# Patient Record
Sex: Female | Born: 1975 | Race: Black or African American | Hispanic: No | Marital: Single | Smoking: Current every day smoker
Health system: Southern US, Community
[De-identification: ages and names within clinical notes are randomized; demographics above are authoritative.]

## PROBLEM LIST (undated history)

## (undated) DIAGNOSIS — D509 Iron deficiency anemia, unspecified: Secondary | ICD-10-CM

## (undated) DIAGNOSIS — I509 Heart failure, unspecified: Secondary | ICD-10-CM

## (undated) HISTORY — DX: Heart failure, unspecified: I50.9

---

## 1998-01-30 ENCOUNTER — Inpatient Hospital Stay (HOSPITAL_COMMUNITY): Admission: AD | Admit: 1998-01-30 | Discharge: 1998-01-30 | Payer: Self-pay | Admitting: Obstetrics & Gynecology

## 1998-02-27 ENCOUNTER — Inpatient Hospital Stay (HOSPITAL_COMMUNITY): Admission: AD | Admit: 1998-02-27 | Discharge: 1998-02-27 | Payer: Self-pay | Admitting: Obstetrics

## 1998-02-28 ENCOUNTER — Inpatient Hospital Stay (HOSPITAL_COMMUNITY): Admission: AD | Admit: 1998-02-28 | Discharge: 1998-03-01 | Payer: Self-pay | Admitting: *Deleted

## 1998-03-03 ENCOUNTER — Inpatient Hospital Stay (HOSPITAL_COMMUNITY): Admission: AD | Admit: 1998-03-03 | Discharge: 1998-03-03 | Payer: Self-pay | Admitting: Obstetrics & Gynecology

## 1998-10-10 ENCOUNTER — Inpatient Hospital Stay (HOSPITAL_COMMUNITY): Admission: AD | Admit: 1998-10-10 | Discharge: 1998-10-10 | Payer: Self-pay | Admitting: *Deleted

## 1998-11-05 ENCOUNTER — Inpatient Hospital Stay (HOSPITAL_COMMUNITY): Admission: AD | Admit: 1998-11-05 | Discharge: 1998-11-05 | Payer: Self-pay | Admitting: Obstetrics

## 1998-11-07 ENCOUNTER — Encounter: Payer: Self-pay | Admitting: Obstetrics

## 1998-11-07 ENCOUNTER — Ambulatory Visit (HOSPITAL_COMMUNITY): Admission: RE | Admit: 1998-11-07 | Discharge: 1998-11-07 | Payer: Self-pay | Admitting: Obstetrics

## 1998-12-03 ENCOUNTER — Emergency Department (HOSPITAL_COMMUNITY): Admission: EM | Admit: 1998-12-03 | Discharge: 1998-12-03 | Payer: Self-pay | Admitting: Emergency Medicine

## 1999-02-09 ENCOUNTER — Encounter (HOSPITAL_COMMUNITY): Admission: RE | Admit: 1999-02-09 | Discharge: 1999-03-14 | Payer: Self-pay | Admitting: Obstetrics

## 1999-02-28 ENCOUNTER — Inpatient Hospital Stay (HOSPITAL_COMMUNITY): Admission: AD | Admit: 1999-02-28 | Discharge: 1999-02-28 | Payer: Self-pay | Admitting: Obstetrics

## 1999-03-10 ENCOUNTER — Observation Stay (HOSPITAL_COMMUNITY): Admission: AD | Admit: 1999-03-10 | Discharge: 1999-03-11 | Payer: Self-pay | Admitting: Obstetrics

## 1999-03-13 ENCOUNTER — Inpatient Hospital Stay (HOSPITAL_COMMUNITY): Admission: AD | Admit: 1999-03-13 | Discharge: 1999-03-15 | Payer: Self-pay | Admitting: *Deleted

## 1999-03-13 ENCOUNTER — Inpatient Hospital Stay (HOSPITAL_COMMUNITY): Admission: AD | Admit: 1999-03-13 | Discharge: 1999-03-13 | Payer: Self-pay | Admitting: *Deleted

## 2000-10-02 ENCOUNTER — Other Ambulatory Visit: Admission: RE | Admit: 2000-10-02 | Discharge: 2000-10-02 | Payer: Self-pay | Admitting: Obstetrics and Gynecology

## 2000-11-26 ENCOUNTER — Observation Stay (HOSPITAL_COMMUNITY): Admission: AD | Admit: 2000-11-26 | Discharge: 2000-11-27 | Payer: Self-pay | Admitting: Obstetrics and Gynecology

## 2001-04-21 ENCOUNTER — Inpatient Hospital Stay (HOSPITAL_COMMUNITY): Admission: AD | Admit: 2001-04-21 | Discharge: 2001-04-22 | Payer: Self-pay | Admitting: *Deleted

## 2003-05-15 ENCOUNTER — Inpatient Hospital Stay (HOSPITAL_COMMUNITY): Admission: AD | Admit: 2003-05-15 | Discharge: 2003-05-17 | Payer: Self-pay | Admitting: Obstetrics & Gynecology

## 2003-05-18 ENCOUNTER — Encounter: Admission: RE | Admit: 2003-05-18 | Discharge: 2003-05-18 | Payer: Self-pay | Admitting: *Deleted

## 2003-06-22 ENCOUNTER — Inpatient Hospital Stay (HOSPITAL_COMMUNITY): Admission: AD | Admit: 2003-06-22 | Discharge: 2003-06-25 | Payer: Self-pay | Admitting: Obstetrics and Gynecology

## 2003-06-23 ENCOUNTER — Encounter (INDEPENDENT_AMBULATORY_CARE_PROVIDER_SITE_OTHER): Payer: Self-pay | Admitting: Specialist

## 2004-12-09 ENCOUNTER — Ambulatory Visit: Payer: Self-pay | Admitting: Obstetrics and Gynecology

## 2004-12-09 ENCOUNTER — Inpatient Hospital Stay (HOSPITAL_COMMUNITY): Admission: AD | Admit: 2004-12-09 | Discharge: 2004-12-11 | Payer: Self-pay | Admitting: *Deleted

## 2010-01-02 ENCOUNTER — Emergency Department (HOSPITAL_COMMUNITY): Admission: EM | Admit: 2010-01-02 | Discharge: 2010-01-02 | Payer: Self-pay | Admitting: Family Medicine

## 2010-01-02 ENCOUNTER — Emergency Department (HOSPITAL_COMMUNITY): Admission: EM | Admit: 2010-01-02 | Discharge: 2010-01-02 | Payer: Self-pay | Admitting: Emergency Medicine

## 2010-01-04 ENCOUNTER — Emergency Department (HOSPITAL_COMMUNITY): Admission: EM | Admit: 2010-01-04 | Discharge: 2010-01-04 | Payer: Self-pay | Admitting: Emergency Medicine

## 2010-08-03 LAB — BASIC METABOLIC PANEL
CO2: 20 mEq/L (ref 19–32)
Calcium: 9.1 mg/dL (ref 8.4–10.5)
Chloride: 104 mEq/L (ref 96–112)
Potassium: 3.1 mEq/L — ABNORMAL LOW (ref 3.5–5.1)

## 2010-08-03 LAB — CBC
Hemoglobin: 8.5 g/dL — ABNORMAL LOW (ref 12.0–15.0)
MCH: 18.9 pg — ABNORMAL LOW (ref 26.0–34.0)
MCHC: 28.5 g/dL — ABNORMAL LOW (ref 30.0–36.0)
RBC: 4.49 MIL/uL (ref 3.87–5.11)
RDW: 19.4 % — ABNORMAL HIGH (ref 11.5–15.5)

## 2010-08-03 LAB — DIFFERENTIAL
Basophils Absolute: 0.1 10*3/uL (ref 0.0–0.1)
Basophils Relative: 1 % (ref 0–1)
Eosinophils Absolute: 0.1 10*3/uL (ref 0.0–0.7)
Eosinophils Relative: 1 % (ref 0–5)
Lymphocytes Relative: 10 % — ABNORMAL LOW (ref 12–46)
Monocytes Relative: 9 % (ref 3–12)

## 2010-10-05 NOTE — Discharge Summary (Signed)
NAME:  Shelby Ford, Shelby Ford                     ACCOUNT NO.:  [id number]   MEDICAL RECORD NO.:  [id number]                   PATIENT TYPE:  INP   LOCATION:  9172                                 FACILITY:  [facility]   PHYSICIAN:  Ursula Beath, MD               DATE OF BIRTH:  [birth year]   DATE OF ADMISSION:  05/15/2003  DATE OF DISCHARGE:  05/17/2003                                 DISCHARGE SUMMARY   DISCHARGE DIAGNOSES:  1. Placental abruption, vaginal bleeding.  2. Urinary tract infection.  3. Anemia.   Please note that this patient has left the hospital against medical advice.   DISCHARGE MEDICATIONS:  Macrobid one tablet b.i.d. x 10 days.   FOLLOWUP:  [facility] on 05/18/2003 between 8:30-9:00.   The patient was admitted, on 05/15/2003, with vaginal bleeding.  She was  laying in bed, the day of admission, when her vaginal bleeding started, and  she passed a large clot.  She denied a history of previous trauma.  Her  source of care was in [place], but we have no records from there.  She has  not had prenatal care for three months.  She is a smoker.  The baby is up  for adoption.   ADMISSION LABS:  Her CBC:  White count 15.9, H&H 7.2 and 21.4, platelets of  184.  PT 13.9, INR 1.1, PTT 23.  A D-dimmer is 0.33.  Chemistry:  Sodium  135, potassium 3.8, chloride 109, bicarb 20, BUN 7, creatinine 0.7, glucose  105.  Total bili 0.5, alk phos 55, SGOT 40, SGPT less than 19, total protein  7.1, albumin 3.2, calcium 8.5, uric acid 4.6.  LDH 207.  Hepatitis B surface  antigen is negative.  Urine drug screen is negative.  Urinalysis is positive  for nitrites and large leukocyte esterase.  She is B positive blood,  negative antibody screen, negative DNA antibody, negative RPR and is rubella  immune.  A urine culture shows greater than 100,000 colonies of E. coli.  Sensitivities were pending at the time of discharge.   MEDICAL HISTORY:  She has:  1. Sickle cell trait.  2. Multiple  fractures.   OBSTETRICAL HISTORY:  She has had four normal spontaneous vaginal deliveries  at term with a history of rapid labors.  She is G5, P4-0-0-4 with one child  that had been adopted.   IMPRESSION:  1. The patient was admitted for her severe anemia and symptoms of this     anemia.  She was given a total of 5 units packed red blood cells, and     prior to discharge, her hemoglobin increased to 8.9.  It should be noted     that she had one subsequent unit of packed red blood cells after that was     done.  Her vaginal bleeding decreased to just some dark spotting.  She     denied contractions and continued to  feel the baby move during her entire     hospitalization.  She did have one notable deceleration and this was     discussed with the patient before her discharge from the hospital, but     she opted to leave the hospital against medical advice,  despite the fact     that she was informed that her bleeding and the deceleration may mean     that her fetus is in trouble, and she could compromise the health or     even life of this fetus.  2. Urinary tract infection.  As noted above in lab results.  She was given     Macrobid at discharge from the hospital.  The baby should be checked in a     high risk clinic as an outpatient.                                               Ursula Beath, MD    JT/MEDQ  D:  05/17/2003  T:  05/17/2003  Job:  [id number]

## 2010-10-05 NOTE — Discharge Summary (Signed)
[facility]  Patient:    Shelby Ford, Shelby Ford                       MRN: [id number] Adm. Date:  11/26/00 Disc. Date: 11/27/00 Attending:  Janine Limbo, M.D. Dictator:   Vance Gather Duplantis, C.N.M.                           Discharge Summary  ADMISSION DIAGNOSES:          1. Intrauterine pregnancy at [redacted] weeks                                  gestation.                               2. Pyelonephritis.  DISCHARGE DIAGNOSES:          1. Intrauterine pregnancy at [redacted] weeks                                  gestation.                               2. Pyelonephritis, resolving.                               3. Significant anemia.  PROCEDURES THIS ADMISSION:    None.  HOSPITAL COURSE:              Shelby Ford is a 35 year old, single black female, gravida 4, para 2-1-1-2 at 18-5/7 weeks who presents complaining of fever, chills, general malaise and right-sided pain for the last couple of days, increasing today, July 10. She was admitted for IV antibiotic therapy secondary to significant fever and positive urinalysis. She has responded well to the IV antibiotics and is no longer febrile and has had no fever since midnight last night. She is tolerating a regular diet. Her vital signs are stable. Her pregnancy has been complicated by history of sickle cell trait and she is anemic on this admission with a hemoglobin of 7.4. She also reports difficulty currently with an address change on her Medicaid card so that she does not have her Medicaid card and will have difficulty filling prescriptions as a result. The patient has been deemed to have received full hospital benefit and is deemed ready for discharge at this time.  DISCHARGE INSTRUCTIONS:       She is to call for reoccurrence of fever, nausea or vomiting, chills, or dysuria. She is also to call for vaginal bleeding or contractions.  DISCHARGE MEDICATIONS:        Macrobid one p.o. b.i.d. for 10 days and  is to pick up samples of the same from [facility] office. She may also take Ibuprofen or Tylenol as needed, and she is also to be on ferrous sulfate b.i.d.  DISCHARGE FOLLOWUP:           The patient is to follow up in one to two weeks at [facility].  DISCHARGE LABORATORIES:       Hemoglobin is 7.4, WBC count is 12.1, and platelets are 223,000.DD:  11/27/00 TD:  11/27/00 Job: [id number]

## 2010-10-05 NOTE — H&P (Signed)
[facility]  Patient:    Shelby Ford, Shelby Ford                     MRN: [id number] Adm. Date:  [id number] Attending:  Leonard Schwartz Dictator:   Mack Guise, C.N.M.                         History and Physical  HISTORY:                      The patient is a 35 year old gravida 4, para 2-1-1-2 at 18-5/7 weeks who presents by EMS for fever, chills and general malaise.  She complains of right-sided pain for the past two days, becoming worse today.  She does not complain of any cold symptoms, no nausea or vomiting.  She does experience some burning with urination.  She reports positive fetal movement.  PAST OBSTETRIC HISTORY:       In 1996, normal spontaneous vaginal delivery with the birth of a 6 lb 2 oz female infant at term with no complications.  iN 1998, SAB.  In 1990, normal spontaneous vaginal delivery with the birth of a 7 lb 2 oz female infant at term with no complications.  PAST MEDICAL HISTORY:         Remarkable for sickle cell trait.  The patient had multiple fractures when she was much younger.  FAMILY HISTORY:               Maternal grandmother with a history of chronic hypertension.  Paternal grandmother with hypertension.  Paternal grandmother with diabetes.  Maternal grandmother rheumatoid arthritis.  Maternal aunt leukemia.  PAST SURGICAL HISTORY:        None.  GENETIC HISTORY:              Patient with a history of sickle cell trait. Her brother and mother also have sickle cell trait.  The father of the baby has a fraternal twin brother.  A first cousin is autistic.  SOCIAL HISTORY:               The patient is a 35 year old single African-American female.  The father of the baby, Adria Devon, is involved and supportive.  They do not subscribe to a religious faith.  The patient is a smoker, but denies the use of alcohol or illicit drugs.  ALLERGIES:                    No known drug allergies.  HISTORY OF THE PRESENT  PREGNANCY:            Her pregnancy has been followed by the [facility] at [facility] and is remarkable for 1) History of rapid labors; 2) Sickle cell trait; 3) Smoker.  REVIEW OF SYSTEMS:            The patient is a 35 year old African-American female at [redacted] weeks gestation who presents via EMS with fever and chills as described above.  PHYSICAL EXAMINATION:  VITAL SIGNS:                  Temperature 103.1, repeated 102.7.  Pulse 64, respirations 22, blood pressure 98/50.  HEENT:                        Unremarkable.  HEART:  Regular rate and rhythm.  LUNGS:                        Clear.  ABDOMEN:                      Gravid in contour.  The uterine fundus is noted to extend 18 cm above the level of the pubic symphysis.  The abdomen is soft and nontender.  Positive fetal heart tones in the 190s.  She has negative CVA tenderness bilaterally.  PELVIC:                       External genitalia normal with no lesions.  The vagina with a white, frothy discharge.  The cervix is long, closed and nontender.  Adnexa show no masses and nontender.  The uterus is gravid, 18 weeks in size and nontender.  EXTREMITIES:                  No edema.  LABORATORY DATA:              Catheterization UA is positive for nitrites, small leukocytes, trace blood, wbcs 21-50 with many bacteria.  CBC shows white blood cell count 12.1, hemoglobin and hematocrit 7.4 and 22.9, platelets 223,000, differential shows neutrophils 76 and bands 17.  Wet prep was positive for Trichomonas, clue cells, many wbcs and bacteria.  GC and Chlamydia cultures were obtained.  ASSESSMENT:                   Intrauterine pregnancy at 18-5/7 weeks with pyelonephritis and Trichomonas.  Consult Dr. Stefano Gaul.  PLAN:                         Admit for 23-hour observation.  IV Ancef q.6h. IV hydration with D5LR at 150 cc/hr.  Tylenol 650 mg p.o. q.4h. for temperature greater than 100.  Phenergan 25 mg p.o. q.6h.  p.r.n. nausea and vomiting.  Metronidazole 2 g p.o. DD:  11/26/00 TD:  11/27/00 Job: [id number]

## 2012-03-16 ENCOUNTER — Emergency Department (HOSPITAL_COMMUNITY)
Admission: EM | Admit: 2012-03-16 | Discharge: 2012-03-16 | Disposition: A | Discharge status: Home / Self Care | Payer: Self-pay | Attending: Emergency Medicine | Admitting: Emergency Medicine

## 2012-03-16 ENCOUNTER — Encounter (HOSPITAL_COMMUNITY): Payer: Self-pay | Admitting: *Deleted

## 2012-03-16 ENCOUNTER — Emergency Department (HOSPITAL_COMMUNITY): Payer: Self-pay

## 2012-03-16 DIAGNOSIS — K219 Gastro-esophageal reflux disease without esophagitis: Secondary | ICD-10-CM

## 2012-03-16 DIAGNOSIS — D649 Anemia, unspecified: Secondary | ICD-10-CM

## 2012-03-16 LAB — CBC WITH DIFFERENTIAL/PLATELET
Basophils Relative: 2 % — ABNORMAL HIGH (ref 0–1)
Eosinophils Relative: 2 % (ref 0–5)
HCT: 24.9 % — ABNORMAL LOW (ref 36.0–46.0)
Hemoglobin: 7.6 g/dL — ABNORMAL LOW (ref 12.0–15.0)
Lymphs Abs: 1.7 10*3/uL (ref 0.7–4.0)
Monocytes Absolute: 0.6 10*3/uL (ref 0.1–1.0)
Monocytes Relative: 13 % — ABNORMAL HIGH (ref 3–12)
Neutrophils Relative %: 44 % (ref 43–77)
RBC: 3.55 MIL/uL — ABNORMAL LOW (ref 3.87–5.11)

## 2012-03-16 LAB — POCT I-STAT, CHEM 8
BUN: 3 mg/dL — ABNORMAL LOW (ref 6–23)
Creatinine, Ser: 0.7 mg/dL (ref 0.50–1.10)
Glucose, Bld: 87 mg/dL (ref 70–99)
Sodium: 141 mEq/L (ref 135–145)
TCO2: 18 mmol/L (ref 0–100)

## 2012-03-16 LAB — D-DIMER, QUANTITATIVE: D-Dimer, Quant: 0.3 ug/mL-FEU (ref 0.00–0.48)

## 2012-03-16 MED ORDER — KETOROLAC TROMETHAMINE 30 MG/ML IJ SOLN
30.0000 mg | Freq: Once | INTRAMUSCULAR | Status: AC
  Administered 2012-03-16: 30 mg via INTRAVENOUS
  Filled 2012-03-16: qty 1

## 2012-03-16 MED ORDER — OMEPRAZOLE 20 MG PO CPDR: 20.0000 mg | DELAYED_RELEASE_CAPSULE | Freq: Every day | ORAL | Status: AC

## 2012-03-16 MED ORDER — FERROUS SULFATE 325 (65 FE) MG PO TABS: 325.0000 mg | ORAL_TABLET | Freq: Three times a day (TID) | ORAL | Status: DC

## 2012-03-16 MED ORDER — GI COCKTAIL ~~LOC~~
30.0000 mL | Freq: Once | ORAL | Status: AC
  Administered 2012-03-16: 30 mL via ORAL
  Filled 2012-03-16: qty 30

## 2012-03-16 NOTE — ED Notes (Signed)
Notified pt that we need a urine sample.  Pt st's she doesn't have to go, will check back shortly.

## 2012-03-16 NOTE — ED Notes (Signed)
Patient back from  X-ray 

## 2012-03-16 NOTE — ED Notes (Signed)
Patient transported to X-ray 

## 2012-03-16 NOTE — ED Notes (Signed)
Per EMS:  Pt had sudden onset of SSCP, non radiating, woke her up from sleep, pain is stabbing, hurts on inspiration and palpation.  Some nausea, pt has non-productive cough, no diaphoresis.  Pt has no medical history, no allergies, no meds.  Pt had 324 ASA in route.

## 2012-03-16 NOTE — ED Provider Notes (Signed)
History     CSN: [id number]  Arrival date & time 03/16/12  0459   First MD Initiated Contact with Patient 03/16/12 [phone]      No chief complaint on file.   (Consider location/radiation/quality/duration/timing/severity/associated sxs/prior treatment) Patient is a 36 y.o. female presenting with chest pain. The history is provided by the patient.  Chest Pain The chest pain began 1 - 2 hours ago. Chest pain occurs frequently. The chest pain is unchanged. Associated with: none. At its most intense, the pain is at 10/10. The pain is currently at 10/10. The severity of the pain is severe. The quality of the pain is described as sharp. The pain does not radiate. Primary symptoms include cough. Pertinent negatives for primary symptoms include no fever, no syncope, no shortness of breath, no wheezing, no palpitations, no abdominal pain, no nausea, no vomiting, no dizziness and no altered mental status.  Pertinent negatives for associated symptoms include no claudication. She tried nothing for the symptoms. Risk factors include smoking/tobacco exposure.  Pertinent negatives for past medical history include no CAD and no MI.  Pertinent negatives for family medical history include: no early MI in family.  Procedure history is negative for cardiac catheterization.     History reviewed. No pertinent past medical history.  History reviewed. No pertinent past surgical history.  No family history on file.  History  Substance Use Topics  . Smoking status: Not on file  . Smokeless tobacco: Not on file  . Alcohol Use: Not on file    OB History    Grav Para Term Preterm Abortions TAB SAB Ect Mult Living                  Review of Systems  Constitutional: Negative for fever.  Respiratory: Positive for cough. Negative for shortness of breath and wheezing.   Cardiovascular: Positive for chest pain. Negative for palpitations, claudication and syncope.  Gastrointestinal: Negative for nausea,  vomiting and abdominal pain.  Neurological: Negative for dizziness.  Psychiatric/Behavioral: Negative for altered mental status.  All other systems reviewed and are negative.    Allergies  Review of patient's allergies indicates no known allergies.  Home Medications  No current outpatient prescriptions on file.  BP 112/67  Pulse 104  Temp 98.5 F (36.9 C) (Oral)  Resp 22  SpO2 100%  Physical Exam  Constitutional: She is oriented to person, place, and time. She appears well-developed and well-nourished. No distress.  HENT:  Head: Normocephalic and atraumatic.  Mouth/Throat: Oropharynx is clear and moist.  Eyes: Conjunctivae normal are normal. Pupils are equal, round, and reactive to light.  Neck: Normal range of motion. Neck supple.  Cardiovascular: Normal rate and regular rhythm.   Pulmonary/Chest: Effort normal and breath sounds normal. No respiratory distress. She has no wheezes. She has no rales.  Abdominal: Soft. Bowel sounds are normal. There is no tenderness. There is no rebound and no guarding.  Musculoskeletal: Normal range of motion.  Neurological: She is alert and oriented to person, place, and time.  Skin: Skin is warm and dry.  Psychiatric: She has a normal mood and affect.    ED Course  Procedures (including critical care time)   Labs Reviewed  CBC WITH DIFFERENTIAL   No results found.   No diagnosis found.    MDM   Date: 03/16/2012  Rate: 89  Rhythm: normal sinus rhythm  QRS Axis: normal  Intervals: normal  ST/T Wave abnormalities: normal  Conduction Disutrbances: none  Narrative Interpretation: unremarkable  Discussed anemia with the patient.  Patient has heavy periods.  Will place on iron and have her follow up with her GYN.  Negative EKG and 2 negative troponins is sufficient to exclude ACS.  Very low suspicion for same pain is very atypical.  Symptoms consistent with GERD given she ate taco salad prior to onset of sx and GI cocktail  relieved the pain.  Return for worsening symptoms. Patient verbalizes understanding and agrees to follow up        Teona Vargus Smitty Cords, MD 03/16/12 [phone]

## 2021-03-22 ENCOUNTER — Emergency Department (HOSPITAL_BASED_OUTPATIENT_CLINIC_OR_DEPARTMENT_OTHER): Payer: 59

## 2021-03-22 ENCOUNTER — Encounter (HOSPITAL_BASED_OUTPATIENT_CLINIC_OR_DEPARTMENT_OTHER): Payer: Self-pay

## 2021-03-22 ENCOUNTER — Emergency Department (HOSPITAL_BASED_OUTPATIENT_CLINIC_OR_DEPARTMENT_OTHER)
Admission: EM | Admit: 2021-03-22 | Discharge: 2021-03-22 | Discharge status: Against Medical Advice | Payer: 59 | Attending: Emergency Medicine | Admitting: Emergency Medicine

## 2021-03-22 ENCOUNTER — Other Ambulatory Visit: Payer: Self-pay

## 2021-03-22 DIAGNOSIS — D649 Anemia, unspecified: Secondary | ICD-10-CM | POA: Diagnosis not present

## 2021-03-22 DIAGNOSIS — R0602 Shortness of breath: Secondary | ICD-10-CM | POA: Diagnosis present

## 2021-03-22 DIAGNOSIS — E876 Hypokalemia: Secondary | ICD-10-CM | POA: Insufficient documentation

## 2021-03-22 DIAGNOSIS — R600 Localized edema: Secondary | ICD-10-CM | POA: Diagnosis not present

## 2021-03-22 DIAGNOSIS — M7989 Other specified soft tissue disorders: Secondary | ICD-10-CM | POA: Diagnosis not present

## 2021-03-22 LAB — CBC WITH DIFFERENTIAL/PLATELET
Abs Immature Granulocytes: 0.02 10*3/uL (ref 0.00–0.07)
Basophils Absolute: 0 10*3/uL (ref 0.0–0.1)
Basophils Relative: 1 %
Eosinophils Absolute: 0 10*3/uL (ref 0.0–0.5)
Eosinophils Relative: 1 %
HCT: 28.7 % — ABNORMAL LOW (ref 36.0–46.0)
Hemoglobin: 7.2 g/dL — ABNORMAL LOW (ref 12.0–15.0)
Immature Granulocytes: 1 %
Lymphocytes Relative: 18 %
Lymphs Abs: 0.6 10*3/uL — ABNORMAL LOW (ref 0.7–4.0)
MCH: 16.2 pg — ABNORMAL LOW (ref 26.0–34.0)
MCHC: 25.1 g/dL — ABNORMAL LOW (ref 30.0–36.0)
MCV: 64.5 fL — ABNORMAL LOW (ref 80.0–100.0)
Monocytes Absolute: 0.2 10*3/uL (ref 0.1–1.0)
Monocytes Relative: 6 %
Neutro Abs: 2.6 10*3/uL (ref 1.7–7.7)
Neutrophils Relative %: 73 %
Platelets: 192 10*3/uL (ref 150–400)
RBC: 4.45 MIL/uL (ref 3.87–5.11)
RDW: 24 % — ABNORMAL HIGH (ref 11.5–15.5)
WBC: 3.5 10*3/uL — ABNORMAL LOW (ref 4.0–10.5)
nRBC: 2.9 % — ABNORMAL HIGH (ref 0.0–0.2)

## 2021-03-22 LAB — COMPREHENSIVE METABOLIC PANEL
ALT: 14 U/L (ref 0–44)
AST: 27 U/L (ref 15–41)
Albumin: 3 g/dL — ABNORMAL LOW (ref 3.5–5.0)
Alkaline Phosphatase: 90 U/L (ref 38–126)
Anion gap: 11 (ref 5–15)
BUN: 7 mg/dL (ref 6–20)
CO2: 19 mmol/L — ABNORMAL LOW (ref 22–32)
Calcium: 8.7 mg/dL — ABNORMAL LOW (ref 8.9–10.3)
Chloride: 106 mmol/L (ref 98–111)
Creatinine, Ser: 0.57 mg/dL (ref 0.44–1.00)
GFR, Estimated: >60 mL/min (ref >60)
Glucose, Bld: 124 mg/dL — ABNORMAL HIGH (ref 70–99)
Potassium: 3.1 mmol/L — ABNORMAL LOW (ref 3.5–5.1)
Sodium: 136 mmol/L (ref 135–145)
Total Bilirubin: 0.6 mg/dL (ref 0.3–1.2)
Total Protein: 6.9 g/dL (ref 6.5–8.1)

## 2021-03-22 LAB — BRAIN NATRIURETIC PEPTIDE: B Natriuretic Peptide: 831.1 pg/mL — ABNORMAL HIGH (ref 0.0–100.0)

## 2021-03-22 LAB — TROPONIN I (HIGH SENSITIVITY): Troponin I (High Sensitivity): 13 ng/L (ref <18)

## 2021-03-22 MED ORDER — POTASSIUM CHLORIDE CRYS ER 20 MEQ PO TBCR
40.0000 meq | EXTENDED_RELEASE_TABLET | Freq: Once | ORAL | Status: AC
  Administered 2021-03-22: 40 meq via ORAL
  Filled 2021-03-22: qty 2

## 2021-03-22 MED ORDER — POTASSIUM CHLORIDE CRYS ER 20 MEQ PO TBCR: 20.0000 meq | EXTENDED_RELEASE_TABLET | Freq: Two times a day (BID) | ORAL | 0 refills | Status: DC

## 2021-03-22 MED ORDER — FUROSEMIDE 20 MG PO TABS: 40.0000 mg | ORAL_TABLET | Freq: Every day | ORAL | 0 refills | Status: DC

## 2021-03-22 NOTE — ED Notes (Signed)
Patient verbalizes understanding of discharge instructions. Opportunity for questioning and answers were provided. Armband removed by staff, pt discharged from ED. Ambulated out to lobby  

## 2021-03-22 NOTE — ED Triage Notes (Signed)
Pt presents with BLE edema with dyspnea on exertion x 1 month. Denies hx of same.

## 2021-03-22 NOTE — ED Provider Notes (Signed)
I provided a substantive portion of the care of this patient.  I personally performed the entirety of the exam and medical decision making for this encounter.  EKG Interpretation  Date/Time:  Thursday March 22 2021 11:53:00 EDT Ventricular Rate:  94 PR Interval:  124 QRS Duration: 76 QT Interval:  342 QTC Calculation: 427 R Axis:   78 Text Interpretation: Normal sinus rhythm ST & T wave abnormality, consider inferolateral ischemia Abnormal ECG When compared to prior, some t wave inversion in v4-v6. No STEMI Confirmed by Antony Blackbird [phone]) on 03/22/2021 12:01:46 PM   Patient reports onset of swelling of her legs for about a month.  She reports that this came on pretty suddenly and then stayed.  She denies any pain associated.  For triage she endorsed dyspnea on exertion but for me she denied it.  She denied chest pain.  Denied fever or cough.  Patient denies abdominal pain nausea or vomiting.  Patient reportedly drinks approximately a case of beer a week.  She denies any history of medical problems.  Denies any regular medications.  Patient is alert with clear mental status.  Nontoxic.  No respiratory distress.  Heart regular.  Borderline tachycardic.  No gross rub murmur gallop.  Patient does have JVD.  Positive hepatojugular reflux. Abdomen is soft and nontender.  Mild distention.  Pitting edema 2+ of the lower legs to the knee.  Trace pitting as high as the hip.  At this point we will proceed with diagnostic evaluation for cardiac pulmonary and hepatic etiology.  I agree with plan of management.  Patient is clinically stable.   Charlesetta Shanks, MD 03/22/21 [phone]

## 2021-03-22 NOTE — ED Provider Notes (Signed)
[facility] Provider Note   CSN: [id number] Arrival date & time: 03/22/21  1058     History Chief Complaint  Patient presents with   Leg Swelling    Shelby Ford is a 45 y.o. female.  HPI Patient presents to the emergency department due to leg swelling as well as shortness of breath which has been waxing and waning for the past month.  States that over the past week her symptoms have continued to progressively worsen.  Notes dyspnea with exertion.  Not at rest.  Denies any chest pain, URI symptoms, fevers, chills, nausea, vomiting, diarrhea, urinary complaints.  Denies any history of similar symptoms in the past.  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  She smokes about 1/2 pack/day and drinks about 24 beers per week.  Denies any recent travel, recent immobilization, hemoptysis, history of blood clots, oral estrogen use.    History reviewed. No pertinent past medical history.  There are no problems to display for this patient.   History reviewed. No pertinent surgical history.   OB History   No obstetric history on file.     History reviewed. No pertinent family history.     Home Medications Prior to Admission medications   Medication Sig Start Date End Date Taking? Authorizing Provider  furosemide (LASIX) 20 MG tablet Take 2 tablets (40 mg total) by mouth daily. 03/22/21 04/21/21 Yes Rayna Sexton, PA-C  potassium chloride SA (KLOR-CON) 20 MEQ tablet Take 1 tablet (20 mEq total) by mouth 2 (two) times daily. 03/22/21 04/21/21 Yes Rayna Sexton, PA-C  ferrous sulfate 325 (65 FE) MG tablet Take 1 tablet (325 mg total) by mouth 3 (three) times daily with meals. 03/16/12   Palumbo, April, MD  omeprazole (PRILOSEC) 20 MG capsule Take 1 capsule (20 mg total) by  mouth daily. 03/16/12   Palumbo, April, MD    Allergies    Patient has no known allergies.  Review of Systems   Review of Systems  All other systems reviewed and are negative. Ten systems reviewed and are negative for acute change, except as noted in the HPI.   Physical Exam Updated Vital Signs BP 103/62   Pulse 86   Temp 98.2 F (36.8 C) (Oral)   Resp 16   Ht 5\' 3"  (1.6 m)   Wt 59.8 kg   LMP 03/08/2021 (Approximate)   SpO2 98%   BMI 23.37 kg/m   Physical Exam Vitals and nursing note reviewed.  Constitutional:      General: She is not in acute distress.    Appearance: Normal appearance. She is not ill-appearing, toxic-appearing or diaphoretic.  HENT:     Head: Normocephalic and atraumatic.     Right Ear: External ear normal.     Left Ear: External ear normal.     Nose: Nose normal.     Mouth/Throat:     Mouth: Mucous membranes are moist.     Pharynx: Oropharynx is clear. No oropharyngeal exudate or posterior oropharyngeal erythema.  Eyes:     General: No scleral icterus.       Right eye: No discharge.        Left eye: No discharge.     Extraocular Movements: Extraocular movements intact.     Conjunctiva/sclera: Conjunctivae normal.  Neck:  Comments: Positive JVD. Cardiovascular:     Rate and Rhythm: Regular rhythm. Tachycardia present.     Pulses: Normal pulses.     Heart sounds: Normal heart sounds. No murmur heard.   No friction rub. No gallop.  Pulmonary:     Effort: Pulmonary effort is normal. No respiratory distress.     Breath sounds: Normal breath sounds. No stridor. No wheezing, rhonchi or rales.     Comments: Lungs are clear to auscultation bilaterally.  Oxygen saturations fluctuating between the mid 80s to the mid 90s on room air. Abdominal:     General: Abdomen is flat.     Palpations: Abdomen is soft.     Tenderness: There is no abdominal tenderness.  Musculoskeletal:        General: Normal range of motion.     Cervical back: Normal range of  motion and neck supple. No tenderness.     Right lower leg: Edema present.     Left lower leg: Edema present.     Comments: 2+ pitting edema noted in the bilateral lower extremities.  Skin:    General: Skin is warm and dry.  Neurological:     General: No focal deficit present.     Mental Status: She is alert and oriented to person, place, and time.  Psychiatric:        Mood and Affect: Mood normal.        Behavior: Behavior normal.   ED Results / Procedures / Treatments   Labs (all labs ordered are listed, but only abnormal results are displayed) Labs Reviewed  COMPREHENSIVE METABOLIC PANEL - Abnormal; Notable for the following components:      Result Value   Potassium 3.1 (*)    CO2 19 (*)    Glucose, Bld 124 (*)    Calcium 8.7 (*)    Albumin 3.0 (*)    All other components within normal limits  BRAIN NATRIURETIC PEPTIDE - Abnormal; Notable for the following components:   B Natriuretic Peptide 831.1 (*)    All other components within normal limits  CBC WITH DIFFERENTIAL/PLATELET - Abnormal; Notable for the following components:   WBC 3.5 (*)    Hemoglobin 7.2 (*)    HCT 28.7 (*)    MCV 64.5 (*)    MCH 16.2 (*)    MCHC 25.1 (*)    RDW 24.0 (*)    nRBC 2.9 (*)    Lymphs Abs 0.6 (*)    All other components within normal limits  CBC WITH DIFFERENTIAL/PLATELET  PREGNANCY, URINE  TROPONIN I (HIGH SENSITIVITY)  TROPONIN I (HIGH SENSITIVITY)   EKG EKG Interpretation  Date/Time:  Thursday March 22 2021 11:53:00 EDT Ventricular Rate:  94 PR Interval:  124 QRS Duration: 76 QT Interval:  342 QTC Calculation: 427 R Axis:   78 Text Interpretation: Normal sinus rhythm ST & T wave abnormality, consider inferolateral ischemia Abnormal ECG When compared to prior, some t wave inversion in v4-v6. No STEMI Confirmed by Antony Blackbird [phone]) on 03/22/2021 12:01:46 PM  Radiology DG Chest 2 View  Result Date: 03/22/2021 CLINICAL DATA:  Shortness of breath EXAM: CHEST - 2 VIEW  COMPARISON:  Chest x-ray 03/16/2012 FINDINGS: Heart is enlarged. Mediastinum appears within normal limits. Pulmonary vasculature is normal. Blunting of the right costophrenic angle with mild irregular opacities at the right lung base. No significant effusion visualized on the lateral film. No pneumothorax. IMPRESSION: 1. Cardiomegaly. 2. Likely trace right pleural effusion with associated atelectasis/infiltrate at the right  lung base. Electronically Signed   By: Ofilia Neas M.D.   On: 03/22/2021 14:51    Procedures Procedures   Medications Ordered in ED Medications  potassium chloride SA (KLOR-CON) CR tablet 40 mEq (has no administration in time range)    ED Course  I have reviewed the triage vital signs and the nursing notes.  Pertinent labs & imaging results that were available during my care of the patient were reviewed by me and considered in my medical decision making (see chart for details).  Clinical Course as of 03/22/21 1958  Thu Mar 22, 2021  1937 Patient discussed with Dr. Sallyanne Kuster.  Patient is refusing admission.  She is going to sign out AMA.  He recommends that we start patient on 40 mg of Lasix once per day and 20 mg of Klor-Con twice per day.  He is going to have his team reach out to her tomorrow to schedule an appointment for follow-up.  I will also give the patient his contact information. [LJ]    Clinical Course User Index [LJ] Rayna Sexton, PA-C   MDM Rules/Calculators/A&P                          Pt is a 45 y.o. female who presents to the emergency department with waxing and waning pedal edema for the past month with intermittent DOE.  Labs: CBC with a white count of 3.5, hemoglobin of 7.2, hematocrit of 20.7, MCV of 64.5, MCH of 16.2, MCHC of 25.1, RDW 24, RBCs of 2.9, lymphocytes of 0.6. CMP with a potassium of 3.1, CO2 of 19, glucose of 124, calcium of 8.7, albumin of 3.0. Troponin of 13. BNP elevated at 831.1.  Imaging: Chest x-ray shows  cardiomegaly.  Likely trace right pleural effusion with associated atelectasis/infiltrate at the right lung base.  I, Rayna Sexton, PA-C, personally reviewed and evaluated these images and lab results as part of my medical decision-making.  Patient with new onset pedal edema as well as intermittent DOE.  Patient smokes 1/2 pack/day and states she drinks about 1 case of beer per week.  Significantly elevated BNP at 831.1.  Troponin within normal limits at 13.  Patient with 2+ pedal edema.  Feel the patient's symptoms are likely secondary to new onset CHF.  Patient discussed with Dr. Sallyanne Kuster.  As patient is declining admission and requesting to sign out AMA, he recommends that we start her on 40 mg of Lasix once per day and 20 mg of Klor-Con twice per day.  He is going to have his staff reach out to her tomorrow to schedule an appointment for reevaluation.  Patient also given his contact information.  Patient found to be hypokalemic at 3.1.  Possibly secondary to her alcohol use.  Repleted with Klor-Con.  Will discharge on a course of Klor-Con.  Patient also found to be anemic at 7.2.  Low MCV at 64.5 with an increased RDW of 24.  Patient reports history of anemia in the past.  Per records, patient's hemoglobin slightly decreased compared to her baseline.   Recommended admission on multiple occasions but patient declined.  She states that she is too busy to be admitted at this time.  I am going to have her sign out [place].  We discussed the risks associated with leaving [facility] and patient verbalized understanding.  Urged her to return to the emergency department if she changes her mind.  She was prescribed Lasix  as well as Klor-Con.  She was given the contact information for cardiology.  Note: Portions of this report may have been transcribed using voice recognition software. Every effort was made to ensure accuracy; however, inadvertent computerized transcription errors  may be present.   Final Clinical Impression(s) / ED Diagnoses Final diagnoses:  Leg swelling  Anemia, unspecified type  Hypokalemia   Rx / DC Orders ED Discharge Orders          Ordered    furosemide (LASIX) 20 MG tablet  Daily        03/22/21 1956    potassium chloride SA (KLOR-CON) 20 MEQ tablet  2 times daily        03/22/21 1956             Rayna Sexton, PA-C 03/22/21 2004    Tegeler, Gwenyth Allegra, MD 03/23/21 [phone]

## 2021-03-22 NOTE — ED Notes (Signed)
Attempted IV, vein blew.  Pt states she is a hard stick.  Notified EDP.   Pt states that she would rather just have labs drawn instead of IV

## 2021-03-22 NOTE — Discharge Instructions (Addendum)
You are leaving [place].  Please understand that there is a risk of sudden death with your symptoms today.  If you change your mind please come back to the emergency department for admission.  I prescribed you 2 medications.  First medication is called Lasix.  I want you to take 40 mg of Lasix once per day.  This medication is a diuretic and will make you urinate more frequently.  This will hopefully help with your leg swelling.  Your potassium today was low.  I am also prescribing a potassium supplement.  Please take 20 mEq twice per day.  Please make sure you are taking this as Lasix will also cause you to have a low potassium.  If you develop any new or worsening symptoms or change your mind regarding admission please come back to the emergency department immediately.  Below is the contact information for Dr. Sallyanne Kuster.  His office should be reaching out to tomorrow to schedule appointment for follow-up.  If you do not hear from them please give his office a call tomorrow.

## 2021-03-22 NOTE — ED Notes (Signed)
Ambulated with pulse ox - sats stayed >96% on RA throughout entire walk. Denied any sob

## 2021-03-22 NOTE — ED Notes (Signed)
Unable to obtain enough blood for sample with redraw. Pt refusing any further attempts at this time.

## 2021-03-23 ENCOUNTER — Telehealth: Payer: Self-pay | Admitting: *Deleted

## 2021-03-23 NOTE — Telephone Encounter (Signed)
-----   Message from [facility] sent at 03/23/2021  9:00 AM EDT ----- LVM for patient to call back  Thanks! ----- Message ----- From: Nuala Alpha, LPN Sent: [id number]   8:36 AM EDT To: [address] Scheduling  Scheduling please assist.  Thanks, Triage   ----- Message ----- From: Reola Mosher Sent: 03/22/2021   8:13 PM EDT To: Lonn Georgia, PA-C, [facility] Triage  New to cards, lives in [place]. Was at [facility] for LE edema and SOB >> CHF by description w/ Hgb 7.2 just to make it interesting. She refused admit.   Is there anyone that can see her quickly, Offer her anything, but try to get her in ASAP.  Thanks [employer]

## 2021-03-23 NOTE — Telephone Encounter (Signed)
Pt is scheduled to see Dr. Stanford Breed at our [place] office on 04/17/21 at [place].  Pt made aware of appt date and time by Imagene Gurney with scheduling dept.

## 2021-03-26 NOTE — Progress Notes (Signed)
Referring-Marcy Pfeiffer, MD Reason for referral-dyspnea/CHF  HPI: 45 year old female for evaluation of dyspnea/CHF at request of Charlesetta Shanks, MD.  Patient seen in the emergency room early November with complaints of lower extremity edema and dyspnea.  Chest x-ray showed cardiomegaly and trace right pleural effusion with associated atelectasis/infiltrate right lung base.  Laboratory showed white blood cell count 3.5, hemoglobin 7.2, MCV 64.5 and platelet count 192.  BNP 831.  Creatinine 0.57, troponin 13.  Albumin 3.0.  Patient states that for the past 2 months she has noticed intermittent bilateral lower extremity edema.  Over the past 2 weeks she noticed increasing dyspnea on exertion.  She denies orthopnea or PND.  She has not had chest pain or syncope.  No recent viral infections.  Consumes 2 alcoholic beverages daily.  Cardiology now asked to evaluate.  Current Outpatient Medications  Medication Sig Dispense Refill   furosemide (LASIX) 20 MG tablet Take 2 tablets (40 mg total) by mouth daily. 60 tablet 0   potassium chloride SA (KLOR-CON) 20 MEQ tablet Take 1 tablet (20 mEq total) by mouth 2 (two) times daily. 60 tablet 0   ferrous sulfate 325 (65 FE) MG tablet Take 1 tablet (325 mg total) by mouth 3 (three) times daily with meals. (Patient not taking: Reported on 03/28/2021) 90 tablet 0   omeprazole (PRILOSEC) 20 MG capsule Take 1 capsule (20 mg total) by mouth daily. (Patient not taking: Reported on 03/28/2021) 30 capsule 0   No current facility-administered medications for this visit.    No Known Allergies   Past Medical History:  Diagnosis Date   CHF (congestive heart failure) (HCC)     Past Surgical History:  Procedure Laterality Date   CESAREAN SECTION      Social History   Socioeconomic History   Marital status: Single    Spouse name: Not on file   Number of children: 4   Years of education: Not on file   Highest education level: Not on file  Occupational  History   Not on file  Tobacco Use   Smoking status: Every Day    Packs/day: 0.50    Types: Cigarettes    Start date: 69   Smokeless tobacco: Never  Substance and Sexual Activity   Alcohol use: Yes    Comment: 2 drinks per day   Drug use: Not on file   Sexual activity: Not on file  Other Topics Concern   Not on file  Social History Narrative   Not on file   Social Determinants of Health   Financial Resource Strain: Not on file  Food Insecurity: Not on file  Transportation Needs: Not on file  Physical Activity: Not on file  Stress: Not on file  Social Connections: Not on file  Intimate Partner Violence: Not on file    Family History  Problem Relation Age of Onset   Diabetes Father     ROS: no fevers or chills, productive cough, hemoptysis, dysphasia, odynophagia, melena, hematochezia, dysuria, hematuria, rash, seizure activity, orthopnea, PND, claudication. Remaining systems are negative.  Physical Exam:   Blood pressure (!) 98/58, pulse 96, height 5\' 3"  (1.6 m), weight 122 lb 12.8 oz (55.7 kg), last menstrual period 03/08/2021, SpO2 100 %.  General:  Well developed/well nourished in NAD Skin warm/dry Patient not depressed No peripheral clubbing Back-normal HEENT-normal/normal eyelids Neck supple/normal carotid upstroke bilaterally; no bruits; positive JVD; no thyromegaly chest - CTA/ normal expansion CV - RRR/normal S1 and S2; positive gallop.  2/6  systolic murmur left sternal border.  PMI is displaced laterally. Abdomen -NT/ND, no HSM, no mass, + bowel sounds, no bruit 2+ femoral pulses, no bruits Ext-1-2+ edema, no chords, 2+ DP Neuro-grossly nonfocal  ECG -March 22, 2021-normal sinus rhythm with nonspecific ST changes.  Personally reviewed  A/P  1 acute systolic congestive heart failure-patient presents with symptoms of CHF.  She is volume overloaded on examination.  Increase Lasix to 60 mg daily.  Check potassium, renal function, hemoglobin and TSH  today.  Schedule echocardiogram to assess LV function.  If she indeed has reduced LV function etiology is unclear.  She has had no recent viral infections.  She consumes 2 alcoholic beverages daily which seems unlikely to cause cardiomyopathy but I have asked her to avoid.  She denies chest pain.  She is anemic which is a chronic issue.  Question if high-output heart failure could be contributing.  If LV function reduced we will add additional CHF medications as tolerated by blood pressure.  Would also need to exclude ischemia.  I will see her back early next week for further evaluation and treatment.  2 microcytic anemia/mild neutropenia-recent hemoglobin 7.2 with MCV 64.5.  Teardrop cells, target cells and giant platelets described.  I will ask hematology to review.  I will also check anemia panel.  Kirk Ruths, MD

## 2021-03-28 ENCOUNTER — Ambulatory Visit (HOSPITAL_COMMUNITY): Payer: 59 | Attending: Cardiology

## 2021-03-28 ENCOUNTER — Encounter: Payer: Self-pay | Admitting: *Deleted

## 2021-03-28 ENCOUNTER — Other Ambulatory Visit: Payer: Self-pay

## 2021-03-28 ENCOUNTER — Ambulatory Visit (INDEPENDENT_AMBULATORY_CARE_PROVIDER_SITE_OTHER): Payer: 59 | Admitting: Cardiology

## 2021-03-28 ENCOUNTER — Encounter: Payer: Self-pay | Admitting: Cardiology

## 2021-03-28 VITALS — BP 98/58 | HR 96 | Ht 63.0 in | Wt 122.8 lb

## 2021-03-28 DIAGNOSIS — D649 Anemia, unspecified: Secondary | ICD-10-CM | POA: Diagnosis not present

## 2021-03-28 DIAGNOSIS — I509 Heart failure, unspecified: Secondary | ICD-10-CM | POA: Insufficient documentation

## 2021-03-28 DIAGNOSIS — I5021 Acute systolic (congestive) heart failure: Secondary | ICD-10-CM | POA: Diagnosis not present

## 2021-03-28 LAB — ECHOCARDIOGRAM COMPLETE
Area-P 1/2: 4.41 cm2
Height: 63 in
S' Lateral: 3.2 cm
Weight: 1964.8 oz

## 2021-03-28 MED ORDER — FUROSEMIDE 20 MG PO TABS: 60.0000 mg | ORAL_TABLET | Freq: Every day | ORAL | 3 refills | Status: DC

## 2021-03-28 NOTE — Patient Instructions (Addendum)
Medication Instructions:   INCREASE FUROSEMIDE TO 60 MG ONCE DAILY= 3 OF THE 20 MG TABLETS ONCE DAILY  *If you need a refill on your cardiac medications before your next appointment, please call your pharmacy*   Lab Work:  Your physician recommends that you HAVE LAB WORK TODAY  If you have labs (blood work) drawn today and your tests are completely normal, you will receive your results only by: [facility] (if you have MyChart) OR A paper copy in the mail If you have any lab test that is abnormal or we need to change your treatment, we will call you to review the results.   Testing/Procedures:  Your physician has requested that you have an echocardiogram. Echocardiography is a painless test that uses sound waves to create images of your heart. It provides your doctor with information about the size and shape of your heart and how well your heart's chambers and valves are working. This procedure takes approximately one hour. There are no restrictions for this procedure. Axis   Follow-Up: At [facility], you and your health needs are our priority.  As part of our continuing mission to provide you with exceptional heart care, we have created designated Provider Care Teams.  These Care Teams include your primary Cardiologist (physician) and Advanced Practice Providers (APPs -  Physician Assistants and Nurse Practitioners) who all work together to provide you with the care you need, when you need it.  We recommend signing up for the patient portal called "MyChart".  Sign up information is provided on this After Visit Summary.  MyChart is used to connect with patients for Virtual Visits (Telemedicine).  Patients are able to view lab/test results, encounter notes, upcoming appointments, etc.  Non-urgent messages can be sent to your provider as well.   To learn more about what you can do with MyChart, go to [email or web address].    Your next appointment:     Monday  04/02/21 @ 4:30 PM WITH DR Stanford Breed

## 2021-04-02 ENCOUNTER — Encounter: Payer: Self-pay | Admitting: *Deleted

## 2021-04-02 ENCOUNTER — Other Ambulatory Visit: Payer: Self-pay

## 2021-04-02 ENCOUNTER — Ambulatory Visit (INDEPENDENT_AMBULATORY_CARE_PROVIDER_SITE_OTHER): Payer: 59 | Admitting: Cardiology

## 2021-04-02 ENCOUNTER — Encounter: Payer: Self-pay | Admitting: Cardiology

## 2021-04-02 VITALS — BP 95/59 | HR 88 | Ht 63.0 in | Wt 124.2 lb

## 2021-04-02 DIAGNOSIS — I509 Heart failure, unspecified: Secondary | ICD-10-CM | POA: Diagnosis not present

## 2021-04-02 DIAGNOSIS — D649 Anemia, unspecified: Secondary | ICD-10-CM

## 2021-04-02 DIAGNOSIS — D151 Benign neoplasm of heart: Secondary | ICD-10-CM | POA: Diagnosis not present

## 2021-04-02 MED ORDER — METOPROLOL TARTRATE 25 MG PO TABS: ORAL_TABLET | ORAL | 0 refills | Status: DC

## 2021-04-02 NOTE — Patient Instructions (Signed)
Lab Work:  Your physician recommends that you HAVE BLOOD WORK TODAY  If you have labs (blood work) drawn today and your tests are completely normal, you will receive your results only by: [facility] (if you have MyChart) OR A paper copy in the mail If you have any lab test that is abnormal or we need to change your treatment, we will call you to review the results.   Testing/Procedures:  Your cardiac CT will be scheduled at   [facility] [place], [place] [address] [phone]   If scheduled at [facility], please arrive at the [facility] main entrance (entrance A) of [facility] 30 minutes prior to test start time. You can use the FREE valet parking offered at the main entrance (encouraged to control the heart rate for the test) Proceed to the [facility] Radiology Department (first floor) to check-in and test prep.   Please follow these instructions carefully (unless otherwise directed):  On the Night Before the Test: Be sure to Drink plenty of water. Do not consume any caffeinated/decaffeinated beverages or chocolate 12 hours prior to your test. Do not take any antihistamines 12 hours prior to your test.   On the Day of the Test: Drink plenty of water until 1 hour prior to the test. Do not eat any food 4 hours prior to the test. You may take your regular medications prior to the test.  Take metoprolol (Lopressor) 25 MG two hours prior to test. HOLD Furosemide/Hydrochlorothiazide morning of the test. FEMALES- please wear underwire-free bra if available, avoid dresses & tight clothing  After the Test: Drink plenty of water. After receiving IV contrast, you may experience a mild flushed feeling. This is normal. On occasion, you may experience a mild rash up to 24 hours after the test. This is not dangerous. If this occurs, you can take Benadryl 25 mg and increase your fluid intake. If you experience trouble breathing,  this can be serious. If it is severe call 911 IMMEDIATELY. If it is mild, please call our office. If you take any of these medications: Glipizide/Metformin, Avandament, Glucavance, please do not take 48 hours after completing test unless otherwise instructed.  Please allow 2-4 weeks for scheduling of routine cardiac CTs. Some insurance companies require a pre-authorization which may delay scheduling of this test.   For non-scheduling related questions, please contact the cardiac imaging nurse navigator should you have any questions/concerns: Marchia Bond, Cardiac Imaging Nurse Navigator Gordy Clement, Cardiac Imaging Nurse Navigator [facility] Direct Office Dial: [phone]   For scheduling needs, including cancellations and rescheduling, please call [place], [phone].    Follow-Up: At [facility], you and your health needs are our priority.  As part of our continuing mission to provide you with exceptional heart care, we have created designated Provider Care Teams.  These Care Teams include your primary Cardiologist (physician) and Advanced Practice Providers (APPs -  Physician Assistants and Nurse Practitioners) who all work together to provide you with the care you need, when you need it.  We recommend signing up for the patient portal called "MyChart".  Sign up information is provided on this After Visit Summary.  MyChart is used to connect with patients for Virtual Visits (Telemedicine).  Patients are able to view lab/test results, encounter notes, upcoming appointments, etc.  Non-urgent messages can be sent to your provider as well.   To learn more about what you can do with MyChart,  go to [email or web address].    Your next appointment:   2 week(s)  The format for your next appointment:   In Person  Provider:   Kirk Ruths MD

## 2021-04-02 NOTE — Progress Notes (Signed)
HPI: Follow-up dyspnea/volume overload/CHF.  Patient seen in the emergency room early November with complaints of lower extremity edema and dyspnea.  Chest x-ray showed cardiomegaly and trace right pleural effusion with associated atelectasis/infiltrate right lung base.  Laboratory showed white blood cell count 3.5, hemoglobin 7.2, MCV 64.5 and platelet count 192.  BNP 831.  Creatinine 0.57, troponin 13.  Albumin 3.0.  I initially saw the patient March 28, 2021 and we increased her Lasix at that time for volume excess.  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  Since last seen she states her dyspnea on exertion has improved.  There is no orthopnea, PND, chest pain or syncope.  Her bilateral lower extremity edema has also improved.  She still has this at times right greater than left.  Current Outpatient Medications  Medication Sig Dispense Refill   furosemide (LASIX) 20 MG tablet Take 3 tablets (60 mg total) by mouth daily. 270 tablet 3   potassium chloride SA (KLOR-CON) 20 MEQ tablet Take 1 tablet (20 mEq total) by mouth 2 (two) times daily. 60 tablet 0   ferrous sulfate 325 (65 FE) MG tablet Take 1 tablet (325 mg total) by mouth 3 (three) times daily with meals. (Patient not taking: Reported on 04/02/2021) 90 tablet 0   omeprazole (PRILOSEC) 20 MG capsule Take 1 capsule (20 mg total) by mouth daily. (Patient not taking: Reported on 04/02/2021) 30 capsule 0   No current facility-administered medications for this visit.     Past Medical History:  Diagnosis Date   CHF (congestive heart failure) (HCC)     Past Surgical History:  Procedure Laterality Date   CESAREAN SECTION      Social History   Socioeconomic History   Marital status: Single    Spouse name: Not on file    Number of children: 4   Years of education: Not on file   Highest education level: Not on file  Occupational History   Not on file  Tobacco Use   Smoking status: Every Day    Packs/day: 0.50    Types: Cigarettes    Start date: 104   Smokeless tobacco: Never  Substance and Sexual Activity   Alcohol use: Yes    Comment: 2 drinks per day   Drug use: Not on file   Sexual activity: Not on file  Other Topics Concern   Not on file  Social History Narrative   Not on file   Social Determinants of Health   Financial Resource Strain: Not on file  Food Insecurity: Not on file  Transportation Needs: Not on file  Physical Activity: Not on file  Stress: Not on file  Social Connections: Not on file  Intimate Partner Violence: Not on file    Family History  Problem Relation Age of Onset   Diabetes Father     ROS: no fevers or chills, productive cough, hemoptysis, dysphasia, odynophagia, melena, hematochezia, dysuria, hematuria, rash, seizure activity, orthopnea, PND, pedal edema, claudication. Remaining systems are negative.  Physical Exam: Well-developed well-nourished in no acute distress.  Skin is warm and dry.  HEENT is normal.  Neck is supple.  Chest is clear to auscultation with normal expansion.  Cardiovascular exam is regular rate and rhythm.  Abdominal exam nontender or distended. No masses palpated. Extremities show trace to 1+ edema. neuro grossly  intact  A/P  1 volume excess/congestive heart failure-symptoms have improved and she has less edema.  We will continue Lasix at present dose.  Check potassium and renal function.  Etiology of her edema is unclear.  I have personally reviewed her echocardiogram.  Her LV systolic and diastolic function are normal.  Her right side is not enlarged.  Her liver functions are normal.  Albumin is mildly decreased at 3.0.  Will check TSH.  I will also check a urinalysis (question nephrotic syndrome).  If she has protein in her urine we  will schedule 24-hour urine to quantitate.  Finally she does have significant anemia and I previously wondered whether she could have a degree of high-output CHF.  2 microcytic anemia/mild neutropenia-as outlined previously she is anemic with a low MCV.  Teardrop cells, target cells and giant platelets were also described.  I have placed a consult to hematology.  We are also awaiting anemia panel and repeat CBC.  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  We will arrange cardiac CTA to rule out coronary disease and then refer to CVTS once the above evaluation of her anemia and volume excess is complete.  Kirk Ruths, MD

## 2021-04-03 ENCOUNTER — Telehealth: Payer: Self-pay | Admitting: *Deleted

## 2021-04-03 DIAGNOSIS — E876 Hypokalemia: Secondary | ICD-10-CM

## 2021-04-03 LAB — CBC
Hematocrit: 10.7 % — CL (ref 34.0–46.6)
Hemoglobin: 2.8 g/dL — CL (ref 11.1–15.9)
MCH: 16 pg — ABNORMAL LOW (ref 26.6–33.0)
MCHC: 26.2 g/dL — ABNORMAL LOW (ref 31.5–35.7)
MCV: 61 fL — ABNORMAL LOW (ref 79–97)
Platelets: 294 10*3/uL (ref 150–450)
RBC: 1.75 x10E6/uL — CL (ref 3.77–5.28)
RDW: 23.1 % — ABNORMAL HIGH (ref 11.7–15.4)
WBC: 8.4 10*3/uL (ref 3.4–10.8)

## 2021-04-03 LAB — VITAMIN B12: Vitamin B-12: 1222 pg/mL (ref 232–1245)

## 2021-04-03 LAB — URINALYSIS
Bilirubin, UA: NEGATIVE
Glucose, UA: NEGATIVE
Ketones, UA: NEGATIVE
Leukocytes,UA: NEGATIVE
Nitrite, UA: NEGATIVE
Protein,UA: NEGATIVE
RBC, UA: NEGATIVE
Specific Gravity, UA: 1.007 (ref 1.005–1.030)
Urobilinogen, Ur: 0.2 mg/dL (ref 0.2–1.0)
pH, UA: 7 (ref 5.0–7.5)

## 2021-04-03 LAB — FERRITIN: Ferritin: 7 ng/mL — ABNORMAL LOW (ref 15–150)

## 2021-04-03 LAB — FOLATE: Folate: 3.2 ng/mL (ref >3.0)

## 2021-04-03 LAB — IRON AND TIBC
Iron Saturation: 3 % — CL (ref 15–55)
Iron: 16 ug/dL — ABNORMAL LOW (ref 27–159)
Total Iron Binding Capacity: 542 ug/dL — ABNORMAL HIGH (ref 250–450)
UIBC: 526 ug/dL — ABNORMAL HIGH (ref 131–425)

## 2021-04-03 LAB — BASIC METABOLIC PANEL
BUN/Creatinine Ratio: 8 — ABNORMAL LOW (ref 9–23)
BUN: 8 mg/dL (ref 6–24)
CO2: 24 mmol/L (ref 20–29)
Calcium: 8.8 mg/dL (ref 8.7–10.2)
Chloride: 97 mmol/L (ref 96–106)
Creatinine, Ser: 0.96 mg/dL (ref 0.57–1.00)
Glucose: 79 mg/dL (ref 70–99)
Potassium: 3.1 mmol/L — ABNORMAL LOW (ref 3.5–5.2)
Sodium: 138 mmol/L (ref 134–144)
eGFR: 75 mL/min/{1.73_m2} (ref >59)

## 2021-04-03 LAB — TSH: TSH: 2.01 u[IU]/mL (ref 0.450–4.500)

## 2021-04-03 MED ORDER — POTASSIUM CHLORIDE CRYS ER 20 MEQ PO TBCR: 40.0000 meq | EXTENDED_RELEASE_TABLET | Freq: Two times a day (BID) | ORAL | 3 refills | Status: AC

## 2021-04-03 NOTE — Telephone Encounter (Signed)
pt aware of results  New script sent to the pharmacy  Lab orders mailed to the pt  

## 2021-04-03 NOTE — Telephone Encounter (Signed)
-----   Message from Lelon Perla, MD sent at 04/03/2021  7:31 AM EST ----- Increase kdur to 40 meq po BID; bmet one week; make sure pt taking FeSo4; make sure she is scheduled to see hematology. Kirk Ruths

## 2021-04-03 NOTE — Telephone Encounter (Signed)
Spoke with pt, aware hgb 2.8 and patient was advised to go to the ER for treatment. 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 Advised patient to go asap, Patient voiced understanding.

## 2021-04-04 ENCOUNTER — Telehealth: Payer: Self-pay | Admitting: *Deleted

## 2021-04-04 ENCOUNTER — Telehealth: Payer: Self-pay | Admitting: Cardiology

## 2021-04-04 NOTE — Telephone Encounter (Signed)
Patient was looking to talk with Dr. Stanford Breed or nurse in regards to filing short term disability. Please call back

## 2021-04-04 NOTE — Telephone Encounter (Signed)
Left message for patient, I do not see that she went to a [facility] to be evaluated for the low blood count. I ask her to give [place] a call.

## 2021-04-04 NOTE — Telephone Encounter (Signed)
-----   Message from Lelon Perla, MD sent at 04/04/2021  7:40 AM EST ----- Needs to be seen in ER and admitted for transfusion and further evaluation of severe microcytic anemia. Kirk Ruths

## 2021-04-04 NOTE — Telephone Encounter (Signed)
See other telephone note from today 

## 2021-04-04 NOTE — Telephone Encounter (Signed)
Spoke with pt, she understands the importance of getting to the hospital and she feels she has something worked out so she can get there tomorrow. She is aware if she gets the short term disability paperwork to [place] we can fill it out for her. She will call me back with any problems.

## 2021-04-04 NOTE — Telephone Encounter (Signed)
Spoke with patient of Dr. Stanford Breed. She said she discussed ST disability with Hilda Blades RN -- routed to her  Advised patient that [place] called her today about low hgb and need for blood transfusion at hospital. Patient is limited in that she has no assistance with child care and has a small child at home. Said her oldest daughter is off on Friday and may can assist. Explained that her hgb is very low and she needs this addressed ASAP.   With patient's permission, will see if Almedia Balls has advice on what to do during these situations when patient needs health needs addressed acutely but is limited by childcare

## 2021-04-05 ENCOUNTER — Telehealth: Payer: Self-pay | Admitting: Cardiology

## 2021-04-05 NOTE — Telephone Encounter (Signed)
Documentation requested was faxed to [facility] 04/05/21.

## 2021-04-05 NOTE — Telephone Encounter (Signed)
04/05/21  Forms given to Dr. Stanford Breed nurse, to provider documentation for patient disability claim.

## 2021-04-06 ENCOUNTER — Other Ambulatory Visit: Payer: Self-pay

## 2021-04-06 ENCOUNTER — Emergency Department (HOSPITAL_COMMUNITY)
Admission: EM | Admit: 2021-04-06 | Discharge: 2021-04-06 | Disposition: A | Discharge status: Home / Self Care | Payer: 59 | Attending: Emergency Medicine | Admitting: Emergency Medicine

## 2021-04-06 ENCOUNTER — Encounter (HOSPITAL_COMMUNITY): Payer: Self-pay

## 2021-04-06 DIAGNOSIS — I509 Heart failure, unspecified: Secondary | ICD-10-CM | POA: Diagnosis not present

## 2021-04-06 DIAGNOSIS — E876 Hypokalemia: Secondary | ICD-10-CM | POA: Diagnosis not present

## 2021-04-06 DIAGNOSIS — R0609 Other forms of dyspnea: Secondary | ICD-10-CM | POA: Diagnosis not present

## 2021-04-06 DIAGNOSIS — F17210 Nicotine dependence, cigarettes, uncomplicated: Secondary | ICD-10-CM | POA: Insufficient documentation

## 2021-04-06 DIAGNOSIS — Z79899 Other long term (current) drug therapy: Secondary | ICD-10-CM | POA: Insufficient documentation

## 2021-04-06 DIAGNOSIS — R799 Abnormal finding of blood chemistry, unspecified: Secondary | ICD-10-CM | POA: Diagnosis present

## 2021-04-06 DIAGNOSIS — D649 Anemia, unspecified: Secondary | ICD-10-CM | POA: Insufficient documentation

## 2021-04-06 DIAGNOSIS — R2243 Localized swelling, mass and lump, lower limb, bilateral: Secondary | ICD-10-CM | POA: Insufficient documentation

## 2021-04-06 HISTORY — DX: Iron deficiency anemia, unspecified: D50.9

## 2021-04-06 LAB — IRON AND TIBC
Iron: 15 ug/dL — ABNORMAL LOW (ref 28–170)
Saturation Ratios: 3 % — ABNORMAL LOW (ref 10.4–31.8)
TIBC: 593 ug/dL — ABNORMAL HIGH (ref 250–450)
UIBC: 578 ug/dL

## 2021-04-06 LAB — URINALYSIS, ROUTINE W REFLEX MICROSCOPIC
Bilirubin Urine: NEGATIVE
Glucose, UA: NEGATIVE mg/dL
Hgb urine dipstick: NEGATIVE
Ketones, ur: NEGATIVE mg/dL
Nitrite: NEGATIVE
Protein, ur: NEGATIVE mg/dL
Specific Gravity, Urine: 1.003 — ABNORMAL LOW (ref 1.005–1.030)
pH: 7 (ref 5.0–8.0)

## 2021-04-06 LAB — RETICULOCYTES
Immature Retic Fract: 21.1 % — ABNORMAL HIGH (ref 2.3–15.9)
RBC.: 1.67 MIL/uL — ABNORMAL LOW (ref 3.87–5.11)
Retic Count, Absolute: 41.6 10*3/uL (ref 19.0–186.0)
Retic Ct Pct: 2.5 % (ref 0.4–3.1)

## 2021-04-06 LAB — COMPREHENSIVE METABOLIC PANEL
ALT: 12 U/L (ref 0–44)
AST: 22 U/L (ref 15–41)
Albumin: 3.6 g/dL (ref 3.5–5.0)
Alkaline Phosphatase: 90 U/L (ref 38–126)
Anion gap: 13 (ref 5–15)
BUN: 6 mg/dL (ref 6–20)
CO2: 28 mmol/L (ref 22–32)
Calcium: 8.9 mg/dL (ref 8.9–10.3)
Chloride: 94 mmol/L — ABNORMAL LOW (ref 98–111)
Creatinine, Ser: 0.6 mg/dL (ref 0.44–1.00)
GFR, Estimated: >60 mL/min (ref >60)
Glucose, Bld: 83 mg/dL (ref 70–99)
Potassium: 2.4 mmol/L — CL (ref 3.5–5.1)
Sodium: 135 mmol/L (ref 135–145)
Total Bilirubin: 0.7 mg/dL (ref 0.3–1.2)
Total Protein: 8.3 g/dL — ABNORMAL HIGH (ref 6.5–8.1)

## 2021-04-06 LAB — CBC WITH DIFFERENTIAL/PLATELET
Abs Immature Granulocytes: 0.04 10*3/uL (ref 0.00–0.07)
Basophils Absolute: 0 10*3/uL (ref 0.0–0.1)
Basophils Relative: 0 %
Eosinophils Absolute: 0.1 10*3/uL (ref 0.0–0.5)
Eosinophils Relative: 2 %
HCT: 10.6 % — ABNORMAL LOW (ref 36.0–46.0)
Hemoglobin: 2.7 g/dL — CL (ref 12.0–15.0)
Immature Granulocytes: 1 %
Lymphocytes Relative: 20 %
Lymphs Abs: 1.1 10*3/uL (ref 0.7–4.0)
MCH: 16.1 pg — ABNORMAL LOW (ref 26.0–34.0)
MCHC: 25.5 g/dL — ABNORMAL LOW (ref 30.0–36.0)
MCV: 63.1 fL — ABNORMAL LOW (ref 80.0–100.0)
Monocytes Absolute: 0.4 10*3/uL (ref 0.1–1.0)
Monocytes Relative: 8 %
Neutro Abs: 4 10*3/uL (ref 1.7–7.7)
Neutrophils Relative %: 69 %
Platelets: 283 10*3/uL (ref 150–400)
RBC: 1.68 MIL/uL — ABNORMAL LOW (ref 3.87–5.11)
RDW: 25 % — ABNORMAL HIGH (ref 11.5–15.5)
WBC: 5.7 10*3/uL (ref 4.0–10.5)
nRBC: 0.5 % — ABNORMAL HIGH (ref 0.0–0.2)

## 2021-04-06 LAB — PREPARE RBC (CROSSMATCH)

## 2021-04-06 LAB — FERRITIN: Ferritin: 3 ng/mL — ABNORMAL LOW (ref 11–307)

## 2021-04-06 LAB — FOLATE: Folate: 5.7 ng/mL — ABNORMAL LOW (ref >5.9)

## 2021-04-06 LAB — VITAMIN B12: Vitamin B-12: 902 pg/mL (ref 180–914)

## 2021-04-06 LAB — ABO/RH: ABO/RH(D): B POS

## 2021-04-06 LAB — LACTATE DEHYDROGENASE: LDH: 189 U/L (ref 98–192)

## 2021-04-06 MED ORDER — POTASSIUM CHLORIDE 10 MEQ/100ML IV SOLN: 10.0000 meq | INTRAVENOUS | Status: AC

## 2021-04-06 MED ORDER — POTASSIUM CHLORIDE CRYS ER 20 MEQ PO TBCR
40.0000 meq | EXTENDED_RELEASE_TABLET | Freq: Once | ORAL | Status: AC
  Administered 2021-04-06: 40 meq via ORAL
  Filled 2021-04-06: qty 2

## 2021-04-06 MED ORDER — SODIUM CHLORIDE 0.9 % IV SOLN
10.0000 mL/h | Freq: Once | INTRAVENOUS | Status: AC
  Administered 2021-04-06: 10 mL/h via INTRAVENOUS

## 2021-04-06 NOTE — ED Provider Notes (Addendum)
[place] DEPT Provider Note   CSN: [id number] Arrival date & time: 04/06/21  1136     History Chief Complaint  Patient presents with   Abnormal Lab    Shelby Ford is a 45 y.o. female.  HPI     45yo female with history of microcytic anemia, aortic valve fibroelastoma, possible CHF with elevated BNP 831 at ED visit, (LV and diastolic function normal per Dr. Stanford Ford), who presents after outpatient labs showed a hgb of 2.8.    Was seen in the ED for bilateral leg swelling, dyspnea previously and BNP was elevated and recommended admission but left AMA-Cardiology saw her as outpt after she started lasix. Reports she feels better than that visit.   History of anemia, smoking, etoh  Feels some dyspnea on exertion began on Wednesday Fatigue, more like didn't sleep last night because worried about coming in today Dr. Hulen Ford her on Wednesday to report hgb 2.7 and told to go to ED but needed to get daughter together.   No nausea/vomiting/abdominal pain/black or bloody stools Last month had menses, sometimes heavy 2-3 times a year, last month was particularly heavy.  4pads/day. No trauma.   No chest pain No lightheadedness or syncope Eating ok Feels pretty good     Past Medical History:  Diagnosis Date   CHF (congestive heart failure) (HCC)    Iron deficiency anemia     There are no problems to display for this patient.   Past Surgical History:  Procedure Laterality Date   CESAREAN SECTION       OB History   No obstetric history on file.     Family History  Problem Relation Age of Onset   Diabetes Father     Social History   Tobacco Use   Smoking status: Every Day    Packs/day: 0.50    Types: Cigarettes    Start date: 1998   Smokeless tobacco: Never  Vaping Use   Vaping Use: Never used  Substance Use Topics   Alcohol use: Yes    Comment: 2 drinks per day   Drug use: Never    Home Medications Prior to  Admission medications   Medication Sig Start Date End Date Taking? Authorizing Provider  furosemide (LASIX) 20 MG tablet Take 3 tablets (60 mg total) by mouth daily. 03/28/21 04/27/21 Yes Shelby Perla, MD  potassium chloride SA (KLOR-CON) 20 MEQ tablet Take 2 tablets (40 mEq total) by mouth 2 (two) times daily. 04/03/21 05/03/21 Yes Shelby Perla, MD  ferrous sulfate 325 (65 FE) MG tablet Take 1 tablet (325 mg total) by mouth 3 (three) times daily with meals. Patient not taking: Reported on 04/02/2021 03/16/12   Ford, April, MD  metoprolol tartrate (LOPRESSOR) 25 MG tablet TAKE 2 HOURS PRIOR TO CT SCAN Patient not taking: Reported on 04/06/2021 04/02/21   Shelby Perla, MD  omeprazole (PRILOSEC) 20 MG capsule Take 1 capsule (20 mg total) by mouth daily. Patient not taking: Reported on 04/02/2021 03/16/12   Shelby Ford, April, MD    Allergies    Patient has no known allergies.  Review of Systems   Review of Systems  Constitutional:  Positive for fatigue (reports from not sleeping last night). Negative for fever.  Respiratory:  Positive for shortness of breath (one xertion).   Cardiovascular:  Positive for leg swelling. Negative for chest pain.  Gastrointestinal:  Negative for abdominal pain, blood in stool, diarrhea, nausea and vomiting.  Genitourinary:  Negative for  dysuria.  Musculoskeletal:  Negative for back pain.  Skin:  Negative for rash and wound.  Neurological:  Negative for syncope and light-headedness.   Physical Exam Updated Vital Signs BP (!) 101/57   Pulse 79   Temp 98.1 F (36.7 C) (Oral)   Resp 17   Ht 5\' 3"  (1.6 m)   Wt 56.7 kg   LMP 03/08/2021 (Approximate)   SpO2 100%   BMI 22.14 kg/m   Physical Exam Vitals and nursing note reviewed.  Constitutional:      General: She is not in acute distress.    Appearance: She is well-developed. She is not diaphoretic.     Comments: pale  HENT:     Head: Normocephalic and atraumatic.  Eyes:      Conjunctiva/sclera: Conjunctivae normal.  Cardiovascular:     Rate and Rhythm: Normal rate and regular rhythm.     Heart sounds: Normal heart sounds. No murmur heard.   No friction rub. No gallop.  Pulmonary:     Effort: Pulmonary effort is normal. No respiratory distress.     Breath sounds: Normal breath sounds. No wheezing or rales.  Abdominal:     General: There is no distension.     Palpations: Abdomen is soft.     Tenderness: There is no abdominal tenderness. There is no guarding.  Musculoskeletal:        General: No tenderness.     Cervical back: Normal range of motion.     Right lower leg: Edema present.     Left lower leg: Edema present.  Skin:    General: Skin is warm and dry.     Findings: No erythema or rash.  Neurological:     Mental Status: She is alert and oriented to person, place, and time.    ED Results / Procedures / Treatments   Labs (all labs ordered are listed, but only abnormal results are displayed) Labs Reviewed  COMPREHENSIVE METABOLIC PANEL - Abnormal; Notable for the following components:      Result Value   Potassium 2.4 (*)    Chloride 94 (*)    Total Protein 8.3 (*)    All other components within normal limits  CBC WITH DIFFERENTIAL/PLATELET - Abnormal; Notable for the following components:   RBC 1.68 (*)    Hemoglobin 2.7 (*)    HCT 10.6 (*)    MCV 63.1 (*)    MCH 16.1 (*)    MCHC 25.5 (*)    RDW 25.0 (*)    nRBC 0.5 (*)    All other components within normal limits  FOLATE - Abnormal; Notable for the following components:   Folate 5.7 (*)    All other components within normal limits  IRON AND TIBC - Abnormal; Notable for the following components:   Iron 15 (*)    TIBC 593 (*)    Saturation Ratios 3 (*)    All other components within normal limits  FERRITIN - Abnormal; Notable for the following components:   Ferritin 3 (*)    All other components within normal limits  RETICULOCYTES - Abnormal; Notable for the following components:    RBC. 1.67 (*)    Immature Retic Fract 21.1 (*)    All other components within normal limits  RESP PANEL BY RT-PCR (FLU A&B, COVID) ARPGX2  VITAMIN B12  LACTATE DEHYDROGENASE  HAPTOGLOBIN  URINALYSIS, ROUTINE W REFLEX MICROSCOPIC  POC OCCULT BLOOD, ED  I-STAT BETA HCG BLOOD, ED ([facility], [facility], [facility] ONLY)  TYPE  AND SCREEN  PREPARE RBC (CROSSMATCH)  ABO/RH    EKG EKG Interpretation  Date/Time:  Friday April 06 2021 16:37:06 EST Ventricular Rate:  87 PR Interval:  137 QRS Duration: 86 QT Interval:  401 QTC Calculation: 483 R Axis:   72 Text Interpretation: Sinus rhythm Probable LVH with secondary repol abnrm No significant change since last tracing Confirmed by Gareth Morgan [id number]) on 04/06/2021 5:18:31 PM  Radiology No results found.  Procedures .Critical Care Performed by: Gareth Morgan, MD Authorized by: Gareth Morgan, MD   Critical care provider statement:    Critical care time (minutes):  30   Critical care was time spent personally by me on the following activities:  Development of treatment plan with patient or surrogate, evaluation of patient's response to treatment, examination of patient, ordering and review of laboratory studies, ordering and review of radiographic studies, ordering and performing treatments and interventions, pulse oximetry, re-evaluation of patient's condition and review of old charts   Medications Ordered in ED Medications  potassium chloride 10 mEq in 100 mL IVPB (has no administration in time range)  0.9 %  sodium chloride infusion (10 mL/hr Intravenous New Bag/Given 04/06/21 1515)  potassium chloride SA (KLOR-CON) CR tablet 40 mEq (40 mEq Oral Given 04/06/21 1644)    ED Course  I have reviewed the triage vital signs and the nursing notes.  Pertinent labs & imaging results that were available during my care of the patient were reviewed by me and considered in my medical decision making (see chart for details).    MDM  Rules/Calculators/A&P                            45yo female with history of microcytic anemia, recent ED evaluation for leg swelling, found to have elevated BNP 831, anemia hgb 7.2, left AMA and followed up with Cardiology Dr. Stanford Ford, found to have aortic valve fibroelastoma, LV and diastolic function normal per Dr. Stanford Ford (consider high output heart failure) who presents after outpatient labs showed a hgb of 2.8.  Discussed risk of blood transfusion and recommendation and ordered initial blood transfusion 2U pRBC after obtaining verbal consent. Repeat blood work shows hgb 2.7, ordered anemia panel, haptoglobin/LDH.  She denies black or bloody stools but also declines rectal exam/hemoccult.  Unclear etiology of significant anemia on history.  Discussed with patient her severe anemia and risk of death associated with this. She continues to state she cannot find childcare for her 43 year old. Discussed options such as having caretaker and child take public transportation to the hospital and daughter staying with her here to ensure she is able to get the treatment she needs, and also encouraged her to look for solutions so that she can get the treatment she needs. Discussed given the severity of her anemia and risk of volume overload do not feel she is appropriate for full ED transfusion, however will initiate transfusion in the ED and continue to discuss admission.  Had this discussion with her multiple times in the ED with risk of death with anemia, risk of volume overload particularly in setting of possible CHF and degree of anemia.  Discussed this with Dr. Ronnald Nian at sign out with CMP pending, K returned with potassium 2.4, ordered replacement.  Will continue to monitor and discuss hopeful admission.  At this time does not have significant dyspnea or finding with exception of edema on exam to suggest worsening baseline CHF.    Final  Clinical Impression(s) / ED Diagnoses Final diagnoses:   Symptomatic anemia  Hypokalemia    Rx / DC Orders ED Discharge Orders     None        Gareth Morgan, MD 04/06/21 1740    Gareth Morgan, MD 04/06/21 2021

## 2021-04-06 NOTE — ED Notes (Signed)
Blood transfusion completed and pt refuses to stay. AMA signature obtained. MD aware. Pt preparing to leave at this time

## 2021-04-06 NOTE — Discharge Instructions (Addendum)
Please return for further care and evaluation as we discussed.  You have severe anemia, you have extremely low blood count and need further work-up for the cause of this.  This is a condition that you could die from if not treated further as discussed.  Suggest that you return for evaluation and admission if you change your mind  I have given you information to follow-up with hematology at the very least.  Please continue to take oral potassium as prescribed by your cardiologist.

## 2021-04-06 NOTE — ED Notes (Signed)
Pt ambulated self from the ED at this time

## 2021-04-06 NOTE — ED Notes (Addendum)
K 2.4, Schlossman, MD notified.

## 2021-04-06 NOTE — ED Provider Notes (Addendum)
Patient handed off to me at 3 PM.  Here with severe anemia with hemoglobin of 2.7.  Potassium of 2.4.  Appears to have iron deficiency anemia.  Will not let us do a rectal exam to evaluate for melena.  We will not let him start second IV for IV potassium.  Given oral potassium.  She is already chronically on potassium.  She denies any black or bloody stools.  She has had leg swelling for the last several weeks.  She was found to have may be soft tissue/mass on her aortic valve on a recent echocardiogram but EF was overall okay.  She has been followed with cardiology for her leg swelling elevated BNP.  She is on Lasix.  She still has menstrual cycles but denies any heavy or major bleeding.  She had a hemoglobin of around 7.2 several weeks ago.  She had outpatient lab work that was ordered by cardiologist on several days ago and her hemoglobin was what it is today around 2.7.  Patient does not want to be admitted to the hospital.  Multiple attempts about talk about risks and benefits have been discussed by myself and with previous doctor.  She has capacity to refuse admission.  She understands that she could have cardiac arrest and death or other complication from this anemia.  It appears to be consistent with an iron deficiency anemia but not sure if there is another process at play.  I did talk with Dr. Lorenso Courier with hematology who will try to arrange for close follow-up.  Patient was agreeable to stay for 2 units of packed red blood cells and some potassium.  I have not fully evaluated for possible GI bleed and overall patient did not want to stay for further work-up.  Strongly encouraged her to come back to the hospital if she changes her mind.  She has been given information to follow-up with hematology.  Ultimately patient left AGAINST MEDICAL ADVICE and understands that she could die from her current medical condition without admission and further treatment.  This chart was dictated using voice recognition  software.  Despite best efforts to proofread,  errors can occur which can change the documentation meaning.    Lennice Sites, DO 04/06/21 2107    Lennice Sites, DO 04/06/21 2110    Lennice Sites, DO 04/06/21 2126

## 2021-04-06 NOTE — ED Triage Notes (Signed)
Patient states she was contacted by her PCP 2 days ago and was told her Hgb was low. Hgb 2.8. Patient states she had to get her daughter situated before coming to the ED. Patient denies any dizziness or syncope.

## 2021-04-07 LAB — HAPTOGLOBIN: Haptoglobin: 225 mg/dL (ref 42–296)

## 2021-04-08 LAB — TYPE AND SCREEN
ABO/RH(D): B POS
Antibody Screen: NEGATIVE
Unit division: 0
Unit division: 0

## 2021-04-08 LAB — BPAM RBC
Blood Product Expiration Date: 202211252359
Blood Product Expiration Date: 202211252359
ISSUE DATE / TIME: 202211181453
ISSUE DATE / TIME: 202211181835
Unit Type and Rh: 7300
Unit Type and Rh: 7300

## 2021-04-09 ENCOUNTER — Telehealth: Payer: Self-pay | Admitting: *Deleted

## 2021-04-09 ENCOUNTER — Telehealth: Payer: Self-pay | Admitting: Hematology and Oncology

## 2021-04-09 NOTE — Telephone Encounter (Signed)
Called pt to sch appt per 11/21 staff msg from Dr. Lorenso Courier. No answer. Left msg for pt to call back to sch appt.

## 2021-04-09 NOTE — Telephone Encounter (Signed)
Left message for patient to call, very concerned about her current health and situation.

## 2021-04-09 NOTE — Progress Notes (Deleted)
HPI: Follow-up dyspnea/volume overload/CHF, fibroelastoma and anemia.  Patient seen in the emergency room early November with complaints of lower extremity edema and dyspnea.  Chest x-ray showed cardiomegaly and trace right pleural effusion with associated atelectasis/infiltrate right lung base.  Laboratory showed white blood cell count 3.5, hemoglobin 7.2, MCV 64.5 and platelet count 192.  BNP 831.  Creatinine 0.57, troponin 13.  Albumin 3.0.  I initially saw the patient March 28, 2021 and we increased her Lasix at that time for volume excess.  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  Coronary CTA November 2022 showed .laboratories were reordered and showed a hemoglobin of 2.8 with MCV 61.  Iron saturation was 3 with total iron binding capacity 542.  Ferritin 7.  LDH and haptoglobin normal.  Patient ultimately went to the emergency room and was transfused.  Since last seen   Current Outpatient Medications  Medication Sig Dispense Refill   ferrous sulfate 325 (65 FE) MG tablet Take 1 tablet (325 mg total) by mouth 3 (three) times daily with meals. (Patient not taking: Reported on 04/02/2021) 90 tablet 0   furosemide (LASIX) 20 MG tablet Take 3 tablets (60 mg total) by mouth daily. 270 tablet 3   metoprolol tartrate (LOPRESSOR) 25 MG tablet TAKE 2 HOURS PRIOR TO CT SCAN (Patient not taking: Reported on 04/06/2021) 1 tablet 0   omeprazole (PRILOSEC) 20 MG capsule Take 1 capsule (20 mg total) by mouth daily. (Patient not taking: Reported on 04/02/2021) 30 capsule 0   potassium chloride SA (KLOR-CON) 20 MEQ tablet Take 2 tablets (40 mEq total) by mouth 2 (two) times daily. 360 tablet 3   No current facility-administered medications for this visit.     Past Medical History:  Diagnosis  Date   CHF (congestive heart failure) (HCC)    Iron deficiency anemia     Past Surgical History:  Procedure Laterality Date   CESAREAN SECTION      Social History   Socioeconomic History   Marital status: Single    Spouse name: Not on file   Number of children: 4   Years of education: Not on file   Highest education level: Not on file  Occupational History   Not on file  Tobacco Use   Smoking status: Every Day    Packs/day: 0.50    Types: Cigarettes    Start date: 90   Smokeless tobacco: Never  Vaping Use   Vaping Use: Never used  Substance and Sexual Activity   Alcohol use: Yes    Comment: 2 drinks per day   Drug use: Never   Sexual activity: Not on file  Other Topics Concern   Not on file  Social History Narrative   Not on file   Social Determinants of Health   Financial Resource Strain: Not on file  Food Insecurity: Not on file  Transportation Needs: Not on file  Physical Activity: Not on file  Stress: Not on file  Social Connections: Not on file  Intimate Partner Violence: Not on file    Family History  Problem Relation Age of Onset   Diabetes Father     ROS: no fevers or chills, productive cough, hemoptysis, dysphasia, odynophagia, melena, hematochezia, dysuria, hematuria, rash, seizure activity, orthopnea, PND, pedal edema, claudication. Remaining systems are negative.  Physical Exam: Well-developed well-nourished in  no acute distress.  Skin is warm and dry.  HEENT is normal.  Neck is supple.  Chest is clear to auscultation with normal expansion.  Cardiovascular exam is regular rate and rhythm.  Abdominal exam nontender or distended. No masses palpated. Extremities show no edema. neuro grossly intact  ECG- personally reviewed  A/P  1 volume excess/congestive heart failure-hemoglobin noted to be 2.8.  Her CHF is felt to be secondary to anemia/high output.  This should improve with correction of her anemia.  Decrease Lasix to 40 mg daily  and will ultimately discontinue.  2 aortic valve fibroblastoma-  3 microcytic anemia/neutropenia-patient will follow with hematology for further evaluation.  Kirk Ruths, MD

## 2021-04-10 ENCOUNTER — Other Ambulatory Visit (HOSPITAL_COMMUNITY): Payer: Self-pay | Admitting: *Deleted

## 2021-04-10 ENCOUNTER — Telehealth (HOSPITAL_COMMUNITY): Payer: Self-pay | Admitting: *Deleted

## 2021-04-10 MED ORDER — IVABRADINE HCL 7.5 MG PO TABS: ORAL_TABLET | ORAL | 0 refills | Status: DC

## 2021-04-10 NOTE — Telephone Encounter (Signed)
Attempted to call patient regarding upcoming cardiac CT appointment and about changes to medications for appointment. Left message on voicemail with name and callback number  Gordy Clement RN Navigator Cardiac [facility] [phone] Office [phone] Cell

## 2021-04-11 ENCOUNTER — Telehealth (HOSPITAL_COMMUNITY): Payer: Self-pay | Admitting: *Deleted

## 2021-04-11 NOTE — Telephone Encounter (Signed)
Reaching out to patient to offer assistance regarding upcoming cardiac imaging study; pt informed about changes to her medication for her test due to her BP and HR. She states that she has trouble purchasing medications at this time and would like to reschedule.  She has been rescheduled to December 7. She was informed that 15mg  ivabradine was sent to the [facility] but if she needed [place] to send a prescription to the [facility] outpatient pharmacy, we could.  Pt made aware she would not be taking 25mg  metoprolol tartrate for test and the Nurse navigators would call to review instructions closer her new appointment.  Gordy Clement RN Navigator Cardiac Imaging [facility] [phone] office [phone] cell

## 2021-04-13 ENCOUNTER — Ambulatory Visit (HOSPITAL_COMMUNITY): Admission: RE | Admit: 2021-04-13 | Payer: 59 | Source: Ambulatory Visit

## 2021-04-16 ENCOUNTER — Telehealth: Payer: Self-pay

## 2021-04-16 NOTE — Telephone Encounter (Signed)
**Note De-Identified Chalet Kerwin Obfuscation** Ivabradine PA started through covermymeds. Key: [id number]

## 2021-04-17 ENCOUNTER — Ambulatory Visit: Payer: 59 | Admitting: Cardiology

## 2021-04-20 ENCOUNTER — Telehealth: Payer: Self-pay | Admitting: Hematology and Oncology

## 2021-04-20 ENCOUNTER — Ambulatory Visit: Payer: 59 | Admitting: Cardiology

## 2021-04-20 NOTE — Telephone Encounter (Signed)
Called pt to sch new pt appt per 11/21 staff msg from Dr. Lorenso Courier. No answer. Left msg for pt to call me back to sch appt.

## 2021-04-20 NOTE — Progress Notes (Signed)
HPI: Follow-up dyspnea/volume overload/CHF, fibroelastoma and anemia.  Patient seen in the emergency room early November with complaints of lower extremity edema and dyspnea.  Chest x-ray showed cardiomegaly and trace right pleural effusion with associated atelectasis/infiltrate right lung base.  Laboratory showed white blood cell count 3.5, hemoglobin 7.2, MCV 64.5 and platelet count 192.  BNP 831.  Creatinine 0.57, troponin 13.  Albumin 3.0.  I initially saw the patient March 28, 2021 and we increased her Lasix at that time for volume excess.  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 Laboratories were reordered and showed a hemoglobin of 2.8 with MCV 61.  Iron saturation was 3 with total iron binding capacity 542.  Ferritin 7.  LDH and haptoglobin normal.  Patient ultimately went to the emergency room and was transfused.  Since last seen she denies dyspnea, chest pain, palpitations or syncope.  Her pedal edema has completely resolved and she has not taken Lasix or potassium in the past 1 week.  She is not taking her iron.  Current Outpatient Medications  Medication Sig Dispense Refill   ferrous sulfate 325 (65 FE) MG tablet Take 1 tablet (325 mg total) by mouth 3 (three) times daily with meals. 90 tablet 0   furosemide (LASIX) 20 MG tablet Take 3 tablets (60 mg total) by mouth daily. 270 tablet 3   ivabradine (CORLANOR) 7.5 MG TABS tablet Please take 15mg  two hours before cardiac CT scan. 2 tablet 0   metoprolol tartrate (LOPRESSOR) 25 MG tablet TAKE 2 HOURS PRIOR TO CT SCAN 1 tablet 0   omeprazole (PRILOSEC) 20 MG capsule Take 1 capsule (20 mg total) by mouth daily. 30 capsule 0   potassium chloride SA (KLOR-CON) 20 MEQ tablet Take 2 tablets (40 mEq total) by mouth 2 (two) times daily.  360 tablet 3   No current facility-administered medications for this visit.     Past Medical History:  Diagnosis Date   CHF (congestive heart failure) (HCC)    Iron deficiency anemia     Past Surgical History:  Procedure Laterality Date   CESAREAN SECTION      Social History   Socioeconomic History   Marital status: Single    Spouse name: Not on file   Number of children: 4   Years of education: Not on file   Highest education level: Not on file  Occupational History   Not on file  Tobacco Use   Smoking status: Every Day    Packs/day: 0.50    Types: Cigarettes    Start date: 2   Smokeless tobacco: Never  Vaping Use   Vaping Use: Never used  Substance and Sexual Activity   Alcohol use: Yes    Comment: 2 drinks per day   Drug use: Never   Sexual activity: Not on file  Other Topics Concern   Not on file  Social History Narrative   Not on file   Social Determinants of Health   Financial Resource Strain: Not on file  Food Insecurity: Not on file  Transportation Needs: Not on file  Physical Activity: Not on file  Stress: Not on file  Social Connections: Not on file  Intimate Partner Violence: Not on file    Family History  Problem Relation Age of Onset   Diabetes Father     ROS: no  fevers or chills, productive cough, hemoptysis, dysphasia, odynophagia, melena, hematochezia, dysuria, hematuria, rash, seizure activity, orthopnea, PND, pedal edema, claudication. Remaining systems are negative.  Physical Exam: Well-developed well-nourished in no acute distress.  Skin is warm and dry.  HEENT is normal.  Neck is supple.  Chest is clear to auscultation with normal expansion.  Cardiovascular exam is regular rate and rhythm.  Abdominal exam nontender or distended. No masses palpated. Extremities show no edema. neuro grossly intact  A/P  1 volume excess/congestive heart failure-hemoglobin noted to be 2.8.  Her CHF is felt to be secondary to anemia/high  output.  She was transfused and now feels much better.  She has not taken Lasix in 1 week and her edema has not returned.  We will resume Lasix if needed.  Check potassium and renal function.  Repeat hemoglobin.  2 aortic valve fibroblastoma-patient is scheduled for cardiac CTA tomorrow.  She will likely need to have this resected as it is high risk for embolic event.  However her anemia needs to be corrected prior to consideration of surgical intervention.  3 microcytic anemia/neutropenia-recheck hemoglobin.  Question all secondary to menstrual loss.  I explained the importance of following up with hematology.  I have also asked her to take iron sulfate 325 mg 3 times daily.    Kirk Ruths, MD

## 2021-04-23 ENCOUNTER — Telehealth (HOSPITAL_COMMUNITY): Payer: Self-pay | Admitting: *Deleted

## 2021-04-23 NOTE — Telephone Encounter (Signed)
Attempted to call patient regarding upcoming cardiac CT appointment. °Left message on voicemail with name and callback number ° °[name] RN Navigator Cardiac Imaging °[facility] °[phone] Office °[phone] Cell ° °

## 2021-04-24 ENCOUNTER — Telehealth (HOSPITAL_COMMUNITY): Payer: Self-pay | Admitting: *Deleted

## 2021-04-24 ENCOUNTER — Encounter: Payer: Self-pay | Admitting: Cardiology

## 2021-04-24 ENCOUNTER — Other Ambulatory Visit: Payer: Self-pay

## 2021-04-24 ENCOUNTER — Telehealth: Payer: Self-pay | Admitting: Licensed Clinical Social Worker

## 2021-04-24 ENCOUNTER — Encounter: Payer: Self-pay | Admitting: *Deleted

## 2021-04-24 ENCOUNTER — Telehealth: Payer: Self-pay | Admitting: Hematology and Oncology

## 2021-04-24 ENCOUNTER — Ambulatory Visit (INDEPENDENT_AMBULATORY_CARE_PROVIDER_SITE_OTHER): Payer: 59 | Admitting: Cardiology

## 2021-04-24 VITALS — BP 132/74 | HR 93 | Ht 63.0 in | Wt 120.6 lb

## 2021-04-24 DIAGNOSIS — D649 Anemia, unspecified: Secondary | ICD-10-CM

## 2021-04-24 DIAGNOSIS — D151 Benign neoplasm of heart: Secondary | ICD-10-CM | POA: Diagnosis not present

## 2021-04-24 DIAGNOSIS — I509 Heart failure, unspecified: Secondary | ICD-10-CM | POA: Diagnosis not present

## 2021-04-24 LAB — BASIC METABOLIC PANEL
BUN/Creatinine Ratio: 8 — ABNORMAL LOW (ref 9–23)
BUN: 5 mg/dL — ABNORMAL LOW (ref 6–24)
CO2: 20 mmol/L (ref 20–29)
Calcium: 8.7 mg/dL (ref 8.7–10.2)
Chloride: 107 mmol/L — ABNORMAL HIGH (ref 96–106)
Creatinine, Ser: 0.61 mg/dL (ref 0.57–1.00)
Glucose: 79 mg/dL (ref 70–99)
Potassium: 3.6 mmol/L (ref 3.5–5.2)
Sodium: 141 mmol/L (ref 134–144)
eGFR: 114 mL/min/{1.73_m2} (ref >59)

## 2021-04-24 LAB — CBC
Hematocrit: 22.1 % — ABNORMAL LOW (ref 34.0–46.6)
Hemoglobin: 6.8 g/dL — CL (ref 11.1–15.9)
MCH: 23.4 pg — ABNORMAL LOW (ref 26.6–33.0)
MCHC: 30.8 g/dL — ABNORMAL LOW (ref 31.5–35.7)
MCV: 76 fL — ABNORMAL LOW (ref 79–97)
Platelets: 296 10*3/uL (ref 150–450)
RBC: 2.91 x10E6/uL — ABNORMAL LOW (ref 3.77–5.28)
RDW: 29.3 % — ABNORMAL HIGH (ref 11.7–15.4)
WBC: 6.4 10*3/uL (ref 3.4–10.8)

## 2021-04-24 MED ORDER — FERROUS SULFATE 325 (65 FE) MG PO TABS
325.0000 mg | ORAL_TABLET | Freq: Three times a day (TID) | ORAL | 6 refills | Status: AC
  Filled 2021-04-24: qty 90, 30d supply, fill #0

## 2021-04-24 MED ORDER — FERROUS SULFATE 325 (65 FE) MG PO TABS: 325.0000 mg | ORAL_TABLET | Freq: Three times a day (TID) | ORAL | 0 refills | Status: DC

## 2021-04-24 MED ORDER — METOPROLOL TARTRATE 25 MG PO TABS
ORAL_TABLET | ORAL | 0 refills | Status: DC
  Filled 2021-04-24: qty 1, 1d supply, fill #0

## 2021-04-24 NOTE — Telephone Encounter (Signed)
Scheduled appt per 11/16 referral. Fredia Beets at [facility] secure chatted me to sch appt. She told me she would communicate appt date and time to pt.

## 2021-04-24 NOTE — Patient Instructions (Signed)
Medication Instructions:   TAKE FERROUS SULFATE 325 MG THREE TIMES DAILY  *If you need a refill on your cardiac medications before your next appointment, please call your pharmacy.    Follow-Up: At [facility], you and your health needs are our priority.  As part of our continuing mission to provide you with exceptional heart care, we have created designated Provider Care Teams.  These Care Teams include your primary Cardiologist (physician) and Advanced Practice Providers (APPs -  Physician Assistants and Nurse Practitioners) who all work together to provide you with the care you need, when you need it.  We recommend signing up for the patient portal called "MyChart".  Sign up information is provided on this After Visit Summary.  MyChart is used to connect with patients for Virtual Visits (Telemedicine).  Patients are able to view lab/test results, encounter notes, upcoming appointments, etc.  Non-urgent messages can be sent to your provider as well.   To learn more about what you can do with MyChart, go to [email or web address].    Your next appointment:   6-8 week(s)  The format for your next appointment:   In Person  Provider:    Kirk Ruths MD

## 2021-04-24 NOTE — Progress Notes (Signed)
Heart and Vascular Care Navigation  04/24/2021  Shelby Ford Aug 30, 1975 [id number]  Reason for Referral:  Medication expense.  Engaged with patient face to face for initial visit for Heart and Vascular Care Coordination.                                                                                                   Assessment:           LCSW met with pt during clinic visit briefly. Introduced self, role, reason for visit. Her daughter Terrence Dupont is present during meeting. Pt states that she normally gets her medications from [facility] and denies issues w/ costs to this Probation officer. Pt has been out of work from job due to ongoing health challenges. LCSW offered assistance at [facility] for medications needed for CT tomorrow. I shared w/ pt that if she is interested/if she feels it is more affordable to obtain medications at [facility] she can have additional medications moved to there. LCSW will f/u on costs of medications, f/u with team leadership about 1x assistance for medication. Pt does have insurance, therefore we are limited in what assistance can be provided. Did discuss GoodRx card w/ pt moving forward- may help w/ additional costs.                        HRT/VAS Care Coordination     Patients Home Cardiology Office [place] Team Social Worker   Social Worker Name: Valeda Malm, [place] [phone]   Lives with: Minor Children   Patient Current Insurance Coverage Commercial Insurance   Patient Has Concern With Paying Medical Bills No   Does Patient Have Prescription Coverage? Yes       Social History:                                                                             SDOH Screenings   Alcohol Screen: Not on file  Depression (QMV7-8): Not on file  Financial Resource Strain: Not on file  Food Insecurity: Not on file  Housing: Not on file  Physical Activity: Not on file  Social Connections: Not on file  Stress: Not on file  Tobacco  Use: High Risk   Smoking Tobacco Use: Every Day   Smokeless Tobacco Use: Never   Passive Exposure: Not on file  Transportation Needs: Not on file     Other Care Navigation Interventions:     Provided Pharmacy assistance resources  Assistance w/ medication at [facility].    Follow-up plan:   LCSW to provide 1x assistance w/ costs of medications (iron and metoprolol tartrate) at [facility]- $4 for both total.  Will update pt when ready and provide address. Hilda Blades shared that pt may be able to receive her corlanor via  samples.  I will f/u also w/ pt later this week when child not present to discuss any additional needs.   Westley Hummer, MSW, [employer]  [phone]- work cell phone (preferred) [phone]- desk phone

## 2021-04-24 NOTE — Telephone Encounter (Signed)
LCSW called pt and updated her that medications are ready and paid for; offered to text or verbally give the [facility] address for her to pick them up. Pt declines stating she knows where it is. LCSW let her know that the meds were $2 each which may be a more affordable cost than she was getting at [facility]. Encouraged her to call w/ any additional questions or concerns.   Westley Hummer, MSW, [place]  [phone]- work cell phone (preferred) [phone]- desk phone

## 2021-04-24 NOTE — Telephone Encounter (Signed)
Patient returning call regarding upcoming cardiac imaging study; pt verbalizes understanding of appt date/time, parking situation and where to check in, pre-test NPO status and medications ordered, and verified current allergies; name and call back number provided for further questions should they arise  Gordy Clement RN Navigator Cardiac Imaging [facility] [phone] office [phone] cell  Patient to take 15mg  ivabradine two hours prior to cardiac CT scan. She is aware to arrive at 3:30pm for her 4pm scan.

## 2021-04-25 ENCOUNTER — Encounter (HOSPITAL_COMMUNITY)
Admission: RE | Admit: 2021-04-25 | Discharge: 2021-04-25 | Disposition: A | Discharge status: Home / Self Care | Payer: 59 | Source: Ambulatory Visit | Attending: Cardiology | Admitting: Cardiology

## 2021-04-25 ENCOUNTER — Other Ambulatory Visit: Payer: Self-pay

## 2021-04-25 ENCOUNTER — Encounter (HOSPITAL_COMMUNITY): Payer: Self-pay

## 2021-04-25 DIAGNOSIS — I509 Heart failure, unspecified: Secondary | ICD-10-CM | POA: Insufficient documentation

## 2021-04-25 MED ORDER — METOPROLOL TARTRATE 5 MG/5ML IV SOLN
5.0000 mg | INTRAVENOUS | Status: DC | PRN
Start: 2021-04-25 — End: 2021-04-26

## 2021-04-25 MED ORDER — NITROGLYCERIN 0.4 MG SL SUBL: 0.8000 mg | SUBLINGUAL_TABLET | Freq: Once | SUBLINGUAL | Status: DC

## 2021-04-25 NOTE — Progress Notes (Signed)
Pt c/o pain in IV site with saline flush upon arrival to CT scanner. Informed pt that current IV will not work for this procedure. Pt refused another IV insertion. States she has "been stuck enough." IV team option offered to pt but she continued to refuse. Per pt, will just reschedule. CT aborted. IV removed. Pt discharged.

## 2021-04-25 NOTE — Telephone Encounter (Signed)
Spoke with patient about coming into [facility] office to sign release so we can send forms to [employer].

## 2021-04-25 NOTE — Telephone Encounter (Addendum)
LCSW called [facility] this morning, and was informed that pt did not pick up medications (expense paid for by care navigation team) ordered yesterday. Pt had confirmed yesterday- both in person and via telephone- to this writer that she knew where pharmacy was, and had transportation.   LCSW updated Debra, RN, that pt had not picked up either the iron or metoprolol. Meds remain ready at pharmacy.   Westley Hummer, MSW, [place]  [phone]- work cell phone (preferred) [phone]- desk phone

## 2021-04-25 NOTE — Telephone Encounter (Signed)
Left message for patient that her medications have been paid for and she needs to pick them up. Explained it is frustrating to help someone and then they do not put forth any effort to help them selves. Explained the importance of both the iron and CTA scan scheduled for today.

## 2021-04-27 ENCOUNTER — Other Ambulatory Visit: Payer: Self-pay

## 2021-05-03 ENCOUNTER — Telehealth: Payer: Self-pay | Admitting: Cardiology

## 2021-05-03 ENCOUNTER — Encounter: Payer: 59 | Admitting: Hematology and Oncology

## 2021-05-03 ENCOUNTER — Telehealth: Payer: Self-pay | Admitting: Hematology and Oncology

## 2021-05-03 DIAGNOSIS — D151 Benign neoplasm of heart: Secondary | ICD-10-CM

## 2021-05-03 NOTE — Telephone Encounter (Signed)
Shelby Ford is calling wanting to speak with Dr. Stanford Breed or his nurse about her CT and appointment she was supposed to have today with hematology. Stating it is about paperwork for it.

## 2021-05-03 NOTE — Telephone Encounter (Signed)
Spoke to patient . She states she went for Ct Scan today .The CT Scan was not done due to not being able to get could IV access. She would like to know will this be reschedule.  Patient also stated she could not see  hematologist today due to a family  issue. She reschedule appointment to 05/25/21.   Patient wanted to know from Dr Stanford Breed  about returning to work . Patient states, Dr Stanford Breed new what type of work she does. Patient aware will defer to Dr Stanford Breed

## 2021-05-03 NOTE — Telephone Encounter (Signed)
Pt called in requesting to r/s her new hem appt with Dr. Alvy Bimler. I r/s appt per pt request, she is aware of new appt date and time.

## 2021-05-04 NOTE — Telephone Encounter (Signed)
Spoke with pt,Aware of dr Jacalyn Lefevre recommendations.  Order placed  Aware from a heart prospective she can return to work per dr Stanford Breed.

## 2021-05-08 ENCOUNTER — Other Ambulatory Visit (HOSPITAL_COMMUNITY): Payer: Self-pay

## 2021-05-08 ENCOUNTER — Other Ambulatory Visit: Payer: Self-pay

## 2021-05-25 ENCOUNTER — Inpatient Hospital Stay: Payer: 59 | Attending: Hematology and Oncology | Admitting: Hematology and Oncology

## 2021-06-04 ENCOUNTER — Encounter: Payer: Self-pay | Admitting: *Deleted

## 2021-06-04 ENCOUNTER — Telehealth: Payer: Self-pay | Admitting: Cardiology

## 2021-06-04 NOTE — Telephone Encounter (Signed)
Patient is requesting to speak with Dr. Jacalyn Lefevre nurse, but did not states what she was calling regarding. Please return call when able.

## 2021-06-04 NOTE — Telephone Encounter (Signed)
Spoke with pt, letter generated and placed at the front desk for patient pick up.

## 2021-06-04 NOTE — Telephone Encounter (Signed)
Patient returning call.

## 2021-06-04 NOTE — Telephone Encounter (Signed)
Unable to reach pt or leave a message mailbox is full 

## 2021-06-04 NOTE — Telephone Encounter (Signed)
Called patient, she states she needs a letter to go back to work. I advised that it looked as if she was to be seen by Oncology/Hematology for her recent visit. Patient did not get seen by this, states she tried to call to reschedule and could never get rescheduled. It did show she was a no show for that visit. I did advise I would have to get the okay from MD to have her return, she states her short term disability has ran out.

## 2021-06-08 ENCOUNTER — Encounter (HOSPITAL_COMMUNITY): Payer: Self-pay

## 2021-06-11 NOTE — Progress Notes (Deleted)
HPI: FU high output CHF, fibroelastoma and anemia.  Patient seen in the emergency room early November with complaints of lower extremity edema and dyspnea.  Chest x-ray showed cardiomegaly and trace right pleural effusion with associated atelectasis/infiltrate right lung base.  Laboratory showed white blood cell count 3.5, hemoglobin 7.2, MCV 64.5 and platelet count 192.  BNP 831.  Creatinine 0.57, troponin 13. Albumin 3.0. 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 Laboratories were reordered and showed a hemoglobin of 2.8 with MCV 61. Iron saturation was 3 with total iron binding capacity 542.  Ferritin 7.  LDH and haptoglobin normal.  Patient ultimately went to the emergency room and was transfused. CHF improved.  She was instructed to follow-up with hematology but this has not been done.  Since last seen  Current Outpatient Medications  Medication Sig Dispense Refill   ferrous sulfate 325 (65 FE) MG tablet Take 1 tablet (325 mg total) by mouth 3 (three) times daily with meals. 90 tablet 6   furosemide (LASIX) 20 MG tablet Take 3 tablets (60 mg total) by mouth daily. 270 tablet 3   ivabradine (CORLANOR) 7.5 MG TABS tablet Please take 15mg  two hours before cardiac CT scan. 2 tablet 0   metoprolol tartrate (LOPRESSOR) 25 MG tablet TAKE 2 HOURS PRIOR TO CT SCAN 1 tablet 0   omeprazole (PRILOSEC) 20 MG capsule Take 1 capsule (20 mg total) by mouth daily. 30 capsule 0   potassium chloride SA (KLOR-CON) 20 MEQ tablet Take 2 tablets (40 mEq total) by mouth 2 (two) times daily. 360 tablet 3   No current facility-administered medications for this visit.     Past Medical History:  Diagnosis Date   CHF (congestive heart failure) (HCC)    Iron deficiency anemia     Past Surgical History:   Procedure Laterality Date   CESAREAN SECTION      Social History   Socioeconomic History   Marital status: Single    Spouse name: Not on file   Number of children: 4   Years of education: Not on file   Highest education level: Not on file  Occupational History   Not on file  Tobacco Use   Smoking status: Every Day    Packs/day: 0.50    Types: Cigarettes    Start date: 56   Smokeless tobacco: Never  Vaping Use   Vaping Use: Never used  Substance and Sexual Activity   Alcohol use: Yes    Comment: 2 drinks per day   Drug use: Never   Sexual activity: Not on file  Other Topics Concern   Not on file  Social History Narrative   Not on file   Social Determinants of Health   Financial Resource Strain: Not on file  Food Insecurity: Not on file  Transportation Needs: Not on file  Physical Activity: Not on file  Stress: Not on file  Social Connections: Not on file  Intimate Partner Violence: Not on file    Family History  Problem Relation Age of Onset   Diabetes Father     ROS: no fevers or chills, productive cough, hemoptysis, dysphasia, odynophagia, melena, hematochezia, dysuria, hematuria, rash, seizure activity, orthopnea, PND, pedal edema, claudication. Remaining systems are negative.  Physical Exam: Well-developed well-nourished in no acute distress.  Skin is warm and dry.  HEENT  is normal.  Neck is supple.  Chest is clear to auscultation with normal expansion.  Cardiovascular exam is regular rate and rhythm.  Abdominal exam nontender or distended. No masses palpated. Extremities show no edema. neuro grossly intact  ECG- personally reviewed  A/P  1 high-output congestive heart failure-previous hemoglobin was 2.8.  CHF felt secondary to high output.  She was transfused with improvement.  Continue Lasix as needed.  Treatment of CHF will be to treat anemia.  2 microcytic anemia/history of neutropenia-we will recheck hemoglobin.  I again asked her to  follow-up with hematology.  We will continue iron.  3 aortic valve fibroblastoma-once anemia addressed/corrected she will need to have aortic valve fibroblastoma resected as I am concerned about the potential for embolic event.  Kirk Ruths, MD

## 2021-06-22 ENCOUNTER — Ambulatory Visit: Payer: 59 | Admitting: Cardiology

## 2021-07-13 ENCOUNTER — Telehealth: Payer: Self-pay | Admitting: Cardiology

## 2021-07-13 NOTE — Telephone Encounter (Signed)
Patient is calling requesting to pick up the letter for her to return back to work that was generated in January.

## 2021-07-13 NOTE — Telephone Encounter (Signed)
Pt informed her letter is at the front desk.

## 2021-07-20 ENCOUNTER — Emergency Department (HOSPITAL_COMMUNITY): Payer: 59

## 2021-07-20 ENCOUNTER — Observation Stay (HOSPITAL_COMMUNITY)
Admission: EM | Admit: 2021-07-20 | Discharge: 2021-07-21 | Disposition: A | Discharge status: Home / Self Care | Payer: 59 | Attending: Obstetrics & Gynecology | Admitting: Obstetrics & Gynecology

## 2021-07-20 ENCOUNTER — Encounter (HOSPITAL_COMMUNITY): Payer: Self-pay

## 2021-07-20 ENCOUNTER — Other Ambulatory Visit: Payer: Self-pay

## 2021-07-20 DIAGNOSIS — I509 Heart failure, unspecified: Secondary | ICD-10-CM | POA: Diagnosis not present

## 2021-07-20 DIAGNOSIS — Z8679 Personal history of other diseases of the circulatory system: Secondary | ICD-10-CM

## 2021-07-20 DIAGNOSIS — N939 Abnormal uterine and vaginal bleeding, unspecified: Secondary | ICD-10-CM | POA: Insufficient documentation

## 2021-07-20 DIAGNOSIS — Z20822 Contact with and (suspected) exposure to covid-19: Secondary | ICD-10-CM | POA: Diagnosis not present

## 2021-07-20 DIAGNOSIS — R945 Abnormal results of liver function studies: Secondary | ICD-10-CM | POA: Insufficient documentation

## 2021-07-20 DIAGNOSIS — Z91199 Patient's noncompliance with other medical treatment and regimen due to unspecified reason: Secondary | ICD-10-CM

## 2021-07-20 DIAGNOSIS — R102 Pelvic and perineal pain: Secondary | ICD-10-CM | POA: Diagnosis not present

## 2021-07-20 DIAGNOSIS — D649 Anemia, unspecified: Secondary | ICD-10-CM | POA: Diagnosis not present

## 2021-07-20 DIAGNOSIS — Z720 Tobacco use: Secondary | ICD-10-CM | POA: Diagnosis present

## 2021-07-20 DIAGNOSIS — I359 Nonrheumatic aortic valve disorder, unspecified: Secondary | ICD-10-CM | POA: Clinically undetermined

## 2021-07-20 DIAGNOSIS — F17210 Nicotine dependence, cigarettes, uncomplicated: Secondary | ICD-10-CM | POA: Diagnosis not present

## 2021-07-20 DIAGNOSIS — R58 Hemorrhage, not elsewhere classified: Secondary | ICD-10-CM | POA: Diagnosis present

## 2021-07-20 DIAGNOSIS — F1010 Alcohol abuse, uncomplicated: Secondary | ICD-10-CM | POA: Clinically undetermined

## 2021-07-20 LAB — CBC WITH DIFFERENTIAL/PLATELET
Abs Immature Granulocytes: 0.08 10*3/uL — ABNORMAL HIGH (ref 0.00–0.07)
Basophils Absolute: 0 10*3/uL (ref 0.0–0.1)
Basophils Relative: 0 %
Eosinophils Absolute: 0 10*3/uL (ref 0.0–0.5)
Eosinophils Relative: 0 %
HCT: 15.6 % — ABNORMAL LOW (ref 36.0–46.0)
Hemoglobin: 4.3 g/dL — CL (ref 12.0–15.0)
Immature Granulocytes: 1 %
Lymphocytes Relative: 18 %
Lymphs Abs: 2 10*3/uL (ref 0.7–4.0)
MCH: 25.4 pg — ABNORMAL LOW (ref 26.0–34.0)
MCHC: 27.6 g/dL — ABNORMAL LOW (ref 30.0–36.0)
MCV: 92.3 fL (ref 80.0–100.0)
Monocytes Absolute: 1.1 10*3/uL — ABNORMAL HIGH (ref 0.1–1.0)
Monocytes Relative: 9 %
Neutro Abs: 8.4 10*3/uL — ABNORMAL HIGH (ref 1.7–7.7)
Neutrophils Relative %: 72 %
Platelets: 239 10*3/uL (ref 150–400)
RBC: 1.69 MIL/uL — ABNORMAL LOW (ref 3.87–5.11)
RDW: 31.8 % — ABNORMAL HIGH (ref 11.5–15.5)
WBC: 11.6 10*3/uL — ABNORMAL HIGH (ref 4.0–10.5)
nRBC: 0.4 % — ABNORMAL HIGH (ref 0.0–0.2)

## 2021-07-20 LAB — COMPREHENSIVE METABOLIC PANEL
ALT: 64 U/L — ABNORMAL HIGH (ref 0–44)
AST: 130 U/L — ABNORMAL HIGH (ref 15–41)
Albumin: 2.6 g/dL — ABNORMAL LOW (ref 3.5–5.0)
Alkaline Phosphatase: 141 U/L — ABNORMAL HIGH (ref 38–126)
Anion gap: 13 (ref 5–15)
BUN: <5 mg/dL — ABNORMAL LOW (ref 6–20)
CO2: 20 mmol/L — ABNORMAL LOW (ref 22–32)
Calcium: 7.5 mg/dL — ABNORMAL LOW (ref 8.9–10.3)
Chloride: 102 mmol/L (ref 98–111)
Creatinine, Ser: 0.72 mg/dL (ref 0.44–1.00)
GFR, Estimated: >60 mL/min (ref >60)
Glucose, Bld: 148 mg/dL — ABNORMAL HIGH (ref 70–99)
Potassium: 3.2 mmol/L — ABNORMAL LOW (ref 3.5–5.1)
Sodium: 135 mmol/L (ref 135–145)
Total Bilirubin: 1 mg/dL (ref 0.3–1.2)
Total Protein: 6.3 g/dL — ABNORMAL LOW (ref 6.5–8.1)

## 2021-07-20 LAB — URINALYSIS, ROUTINE W REFLEX MICROSCOPIC
Bilirubin Urine: NEGATIVE
Glucose, UA: NEGATIVE mg/dL
Ketones, ur: NEGATIVE mg/dL
Leukocytes,Ua: NEGATIVE
Nitrite: NEGATIVE
Protein, ur: NEGATIVE mg/dL
RBC / HPF: >50 RBC/hpf — ABNORMAL HIGH (ref 0–5)
Specific Gravity, Urine: 1.004 — ABNORMAL LOW (ref 1.005–1.030)
pH: 7 (ref 5.0–8.0)

## 2021-07-20 LAB — I-STAT CHEM 8, ED
BUN: <3 mg/dL — ABNORMAL LOW (ref 6–20)
Calcium, Ion: 0.92 mmol/L — ABNORMAL LOW (ref 1.15–1.40)
Chloride: 99 mmol/L (ref 98–111)
Creatinine, Ser: 0.4 mg/dL — ABNORMAL LOW (ref 0.44–1.00)
Glucose, Bld: 139 mg/dL — ABNORMAL HIGH (ref 70–99)
HCT: 17 % — ABNORMAL LOW (ref 36.0–46.0)
Hemoglobin: 5.8 g/dL — CL (ref 12.0–15.0)
Potassium: 3.1 mmol/L — ABNORMAL LOW (ref 3.5–5.1)
Sodium: 136 mmol/L (ref 135–145)
TCO2: 21 mmol/L — ABNORMAL LOW (ref 22–32)

## 2021-07-20 LAB — RESP PANEL BY RT-PCR (FLU A&B, COVID) ARPGX2
Influenza A by PCR: NEGATIVE
Influenza B by PCR: NEGATIVE
SARS Coronavirus 2 by RT PCR: NEGATIVE

## 2021-07-20 LAB — RAPID URINE DRUG SCREEN, HOSP PERFORMED
Amphetamines: NOT DETECTED
Barbiturates: NOT DETECTED
Benzodiazepines: NOT DETECTED
Cocaine: NOT DETECTED
Opiates: NOT DETECTED
Tetrahydrocannabinol: NOT DETECTED

## 2021-07-20 LAB — I-STAT BETA HCG BLOOD, ED (MC, WL, AP ONLY): I-stat hCG, quantitative: <5 m[IU]/mL (ref <5)

## 2021-07-20 LAB — PROTIME-INR
INR: 1.3 — ABNORMAL HIGH (ref 0.8–1.2)
Prothrombin Time: 16.1 seconds — ABNORMAL HIGH (ref 11.4–15.2)

## 2021-07-20 LAB — ETHANOL: Alcohol, Ethyl (B): <10 mg/dL (ref <10)

## 2021-07-20 LAB — PREPARE RBC (CROSSMATCH)

## 2021-07-20 MED ORDER — DIPHENHYDRAMINE HCL 25 MG PO CAPS
25.0000 mg | ORAL_CAPSULE | Freq: Every evening | ORAL | Status: DC | PRN
  Administered 2021-07-20: 25 mg via ORAL
  Filled 2021-07-20: qty 1

## 2021-07-20 MED ORDER — SODIUM CHLORIDE 0.9 % IV BOLUS
1000.0000 mL | Freq: Once | INTRAVENOUS | Status: AC
  Administered 2021-07-20: 1000 mL via INTRAVENOUS

## 2021-07-20 MED ORDER — SODIUM CHLORIDE 0.9% IV SOLUTION: Freq: Once | INTRAVENOUS | Status: DC

## 2021-07-20 MED ORDER — FUROSEMIDE 10 MG/ML IJ SOLN
20.0000 mg | Freq: Once | INTRAMUSCULAR | Status: AC
  Administered 2021-07-20: 20 mg via INTRAVENOUS
  Filled 2021-07-20: qty 2

## 2021-07-20 MED ORDER — THIAMINE HCL 100 MG PO TABS
100.0000 mg | ORAL_TABLET | Freq: Every day | ORAL | Status: DC
  Administered 2021-07-20 – 2021-07-21 (×2): 100 mg via ORAL
  Filled 2021-07-20 (×2): qty 1

## 2021-07-20 MED ORDER — FOLIC ACID 1 MG PO TABS
1.0000 mg | ORAL_TABLET | Freq: Every day | ORAL | Status: DC
  Administered 2021-07-20 – 2021-07-21 (×2): 1 mg via ORAL
  Filled 2021-07-20 (×2): qty 1

## 2021-07-20 MED ORDER — POTASSIUM CHLORIDE CRYS ER 20 MEQ PO TBCR
20.0000 meq | EXTENDED_RELEASE_TABLET | Freq: Two times a day (BID) | ORAL | Status: DC
  Administered 2021-07-20: 20 meq via ORAL
  Filled 2021-07-20: qty 1

## 2021-07-20 MED ORDER — ONDANSETRON HCL 4 MG/2ML IJ SOLN: 4.0000 mg | Freq: Four times a day (QID) | INTRAMUSCULAR | Status: DC | PRN

## 2021-07-20 MED ORDER — LORAZEPAM 1 MG PO TABS
1.0000 mg | ORAL_TABLET | ORAL | Status: DC | PRN
  Administered 2021-07-21: 2 mg via ORAL
  Filled 2021-07-20: qty 2

## 2021-07-20 MED ORDER — PRENATAL MULTIVITAMIN CH: 1.0000 | ORAL_TABLET | Freq: Every day | ORAL | Status: DC

## 2021-07-20 MED ORDER — MEGESTROL ACETATE 40 MG PO TABS
80.0000 mg | ORAL_TABLET | Freq: Two times a day (BID) | ORAL | Status: DC
  Administered 2021-07-20 – 2021-07-21 (×3): 80 mg via ORAL
  Filled 2021-07-20 (×4): qty 2

## 2021-07-20 MED ORDER — ONDANSETRON HCL 4 MG PO TABS: 4.0000 mg | ORAL_TABLET | Freq: Four times a day (QID) | ORAL | Status: DC | PRN

## 2021-07-20 MED ORDER — NICOTINE 14 MG/24HR TD PT24
14.0000 mg | MEDICATED_PATCH | Freq: Every day | TRANSDERMAL | Status: DC
  Administered 2021-07-20 – 2021-07-21 (×2): 14 mg via TRANSDERMAL
  Filled 2021-07-20 (×2): qty 1

## 2021-07-20 MED ORDER — LORAZEPAM 2 MG/ML IJ SOLN: 1.0000 mg | INTRAMUSCULAR | Status: DC | PRN

## 2021-07-20 MED ORDER — IBUPROFEN 600 MG PO TABS: 600.0000 mg | ORAL_TABLET | Freq: Four times a day (QID) | ORAL | Status: DC | PRN

## 2021-07-20 MED ORDER — SODIUM CHLORIDE 0.9 % IV SOLN
10.0000 mL/h | Freq: Once | INTRAVENOUS | Status: AC
Start: 2021-07-20 — End: 2021-07-20
  Administered 2021-07-20: 10 mL/h via INTRAVENOUS

## 2021-07-20 MED ORDER — ADULT MULTIVITAMIN W/MINERALS CH
1.0000 | ORAL_TABLET | Freq: Every day | ORAL | Status: DC
  Administered 2021-07-20 – 2021-07-21 (×2): 1 via ORAL
  Filled 2021-07-20 (×2): qty 1

## 2021-07-20 MED ORDER — THIAMINE HCL 100 MG/ML IJ SOLN
100.0000 mg | Freq: Every day | INTRAMUSCULAR | Status: DC
  Filled 2021-07-20 (×2): qty 1

## 2021-07-20 MED ORDER — SODIUM CHLORIDE 0.9 % IV SOLN: INTRAVENOUS | Status: DC

## 2021-07-20 MED ORDER — ACETAMINOPHEN 325 MG PO TABS
650.0000 mg | ORAL_TABLET | Freq: Four times a day (QID) | ORAL | Status: DC | PRN
  Administered 2021-07-21 (×2): 650 mg via ORAL
  Filled 2021-07-20 (×2): qty 2

## 2021-07-20 NOTE — ED Notes (Signed)
Dr. Sabra Heck notified of hgb of 4.3 ?

## 2021-07-20 NOTE — H&P (Addendum)
[facility] Obstetrics and Gynecology Attending History and Physical ? ?Shelby Ford is a 46 y.o. G4P4 presented due to heavy menstrual bleeding.  Normally menses regular each month and last for about 4-5 days. Skipped February and then started having heavy bleeding that started a few days ago.  Reports soaking through 2 pads in an hour.  Notes some dysmenorrhea.  She was hopeful it would stop on its own, but when she noted SOB and feeling light-headed she decided to come to ER. ? ?Some nausea, no vomiting.  No urinary or GI issues.  She has not been seen by gyn in several years.  Slight headache.  Denies syncope.  No chest pain or heart palpitations.  Denies vaginal itching or irritation.  Denies abdominal/pelvic pain currently.  Same partner. ?No other acute complaints ? ? ?Of note, pt has been diagnosed with CHF and fibroelastoma.  She does not want surgical intervention as previously suggested and has not been taking previously prescribed medications. ?Past Medical History:  ?Diagnosis Date  ? CHF (congestive heart failure) ([place])   ? Iron deficiency anemia   ? ?Past Surgical History:  ?Procedure Laterality Date  ? CESAREAN SECTION    ? ?OB History  ?Gravida Para Term Preterm AB Living  ?4 4          ?SAB IAB Ectopic Multiple Live Births  ?           ?  ?# Outcome Date GA Lbr Len/2nd Weight Sex Delivery Anes PTL Lv  ?4 Para           ?3 Para           ?2 Para           ?1 Para           ? ? ?Meds: iron daily ? ?Allergies  ?Allergen Reactions  ? Shrimp (Diagnostic) Other (See Comments)  ?  Throat closes  ? ? ?Social History:   reports that she has been smoking cigarettes. She started smoking about 25 years ago. She has been smoking an average of .5 packs per day. She has never used smokeless tobacco. She reports current alcohol use- about 3 cans beer per day and some liquor.  She reports that she does not use drugs. ?Family History  ?Problem Relation Age of Onset  ? Diabetes Father   ? ? ?Review of  Systems: Pertinent items noted in HPI and remainder of comprehensive ROS otherwise negative. ? ?PHYSICAL EXAM: ?Blood pressure 95/61, pulse (!) 108, temperature 98.8 ?F (37.1 ?C), temperature source Oral, resp. rate 16, last menstrual period 06/04/2021, SpO2 100 %. ?CONSTITUTIONAL: Well-developed female in no acute distress.  ?HENT:  Normocephalic, atraumatic, External right and left ear normal. Oropharynx is clear and moist ?EYES: Conjunctivae and EOM are normal. Pupils are equal, round, and reactive to light. NECK: Normal range of motion, supple, no masses ?SKIN: Skin is warm and dry. No rash noted. Not diaphoretic. + pallor ?NEUROLOGIC: Alert and oriented to person, place, and time. Normal reflexes, muscle tone coordination. No cranial nerve deficit noted. ?PSYCHIATRIC: Normal mood and affect. Normal behavior. Normal judgment and thought content. ?CARDIOVASCULAR: tachycardic ?RESPIRATORY: Effort and breath sounds normal, CTAB ?ABDOMEN: Soft, nontender, nondistended. ?PELVIC: vaginal packing removed, ~ 20cc of dark blood noted on pad, Normal appearing external genitalia; normal appearing vaginal mucosa and cervix.  Normal appearing discharge.  Normal uterine size, no other palpable masses, no uterine or adnexal tenderness,  ?MUSCULOSKELETAL: Normal range of motion. No tenderness.  No edema.   ? ?Labs: ?Results for orders placed or performed during the hospital encounter of 07/20/21 (from the past 336 hour(s))  ?Type and screen [place]  ? Collection Time: 07/20/21  1:56 PM  ?Result Value Ref Range  ? ABO/RH(D) PENDING   ? Antibody Screen PENDING   ? Sample Expiration    ?  07/23/2021,2359 ?Performed at [facility], [place] [address], [place], [place] [address] ?  ?CBC with Differential  ? Collection Time: 07/20/21  2:17 PM  ?Result Value Ref Range  ? WBC 11.6 (H) 4.0 - 10.5 K/uL  ? RBC 1.69 (L) 3.87 - 5.11 MIL/uL  ? Hemoglobin 4.3 (LL) 12.0 - 15.0 g/dL  ? HCT 15.6 (L) 36.0 - 46.0 %  ? MCV  92.3 80.0 - 100.0 fL  ? MCH 25.4 (L) 26.0 - 34.0 pg  ? MCHC 27.6 (L) 30.0 - 36.0 g/dL  ? RDW 31.8 (H) 11.5 - 15.5 %  ? Platelets 239 150 - 400 K/uL  ? nRBC 0.4 (H) 0.0 - 0.2 %  ? Neutrophils Relative % 72 %  ? Neutro Abs 8.4 (H) 1.7 - 7.7 K/uL  ? Lymphocytes Relative 18 %  ? Lymphs Abs 2.0 0.7 - 4.0 K/uL  ? Monocytes Relative 9 %  ? Monocytes Absolute 1.1 (H) 0.1 - 1.0 K/uL  ? Eosinophils Relative 0 %  ? Eosinophils Absolute 0.0 0.0 - 0.5 K/uL  ? Basophils Relative 0 %  ? Basophils Absolute 0.0 0.0 - 0.1 K/uL  ? WBC Morphology See Note   ? RBC Morphology See Note   ? Smear Review MORPHOLOGY UNREMARKABLE   ? Immature Granulocytes 1 %  ? Abs Immature Granulocytes 0.08 (H) 0.00 - 0.07 K/uL  ? Hypersegmented Neutrophils PRESENT   ? Polychromasia PRESENT   ?Protime-INR  ? Collection Time: 07/20/21  2:17 PM  ?Result Value Ref Range  ? Prothrombin Time 16.1 (H) 11.4 - 15.2 seconds  ? INR 1.3 (H) 0.8 - 1.2  ?Prepare RBC (crossmatch)  ? Collection Time: 07/20/21  2:29 PM  ?Result Value Ref Range  ? Order Confirmation    ?  ORDER PROCESSED BY BLOOD BANK ?Performed at [facility], [place] [address] [place], [place] [address] ?  ?I-Stat beta hCG blood, ED  ? Collection Time: 07/20/21  3:01 PM  ?Result Value Ref Range  ? I-stat hCG, quantitative <5.0 <5 mIU/mL  ? Comment 3          ?I-stat chem 8, ED  ? Collection Time: 07/20/21  3:03 PM  ?Result Value Ref Range  ? Sodium 136 135 - 145 mmol/L  ? Potassium 3.1 (L) 3.5 - 5.1 mmol/L  ? Chloride 99 98 - 111 mmol/L  ? BUN <3 (L) 6 - 20 mg/dL  ? Creatinine, Ser 0.40 (L) 0.44 - 1.00 mg/dL  ? Glucose, Bld 139 (H) 70 - 99 mg/dL  ? Calcium, Ion 0.92 (L) 1.15 - 1.40 mmol/L  ? TCO2 21 (L) 22 - 32 mmol/L  ? Hemoglobin 5.8 (LL) 12.0 - 15.0 g/dL  ? HCT 17.0 (L) 36.0 - 46.0 %  ? Comment NOTIFIED PHYSICIAN   ? ? ?Imaging Studies: Pelvic US in process-  Normal uterine size and shape.  Thickened endometrium noted.  Normal ovaries bilaterally. ? ?Assessment: ?Symptomatic anemia ?Abnormal  uterine bleeding ? ?Plan: ?1) AUB ?-Plan for transfusion of 3upRBC, repeat H&H post transfusion will determine if additional blood is needed ?-Megace 80mg  bid ?-continue iron  every other day ? ?2) FEN ?-regular diet ?-activity as tolerated ?-NS @ 75cc/hr ?-hypokalemia noted- plan to monitor ? ?3) Elevated LFTs ?-hepatitis panel ordered ?-may be due to alcohol consumption though not previously elevated ? ? ?Janyth Pupa, DO ?Attending [place], Faculty Practice ?[facility] [place] ?  ?

## 2021-07-20 NOTE — ED Provider Notes (Signed)
Patient is a 46 year old female who presents to the emergency department due to vaginal bleeding for the past 2 days.  Her care was transferred to me at shift change from [facility].  Please see her note below for additional information.  Megace as well as 3 units of PRBCs have been ordered.  Patient pending OB/GYN evaluation at the time of shift change.  Dr. Nelda Marseille with OB/GYN has since evaluated the patient and agrees with overnight admission for blood transfusion and further management. ?  ?Rayna Sexton, PA-C ?07/20/21 1526 ? ?  ?Wyvonnia Dusky, MD ?07/20/21 1557 ? ?

## 2021-07-20 NOTE — ED Provider Triage Note (Signed)
Emergency Medicine Provider Triage Evaluation Note ? ?Shelby Ford , a 46 y.o. female  was evaluated in triage.  Pt complains of vaginal bleeding over the last 2 days.  Describes blood as "gushing".  Now feels lightheaded.  Possibility of pregnancy.  LMP sometime in January.  Hx CHF and IDA.  Current tobacco use.  Not on anticoags.  Hx of transfusion without complications. ? ?Nurse reports witnessing a constant stream of blood when patient stood up and was over the toilet. ? ?Review of Systems  ?Positive: As above ?Negative: As above ? ?Physical Exam  ?BP (!) 99/56 (BP Location: Right Arm)   Pulse (!) 137   Temp 98.8 ?F (37.1 ?C) (Oral)   Resp 16   LMP 06/04/2021 (Approximate)   SpO2 100%  ?Gen:   Awake, no distress  ?Resp:  Normal effort, CTAB ?MSK:   Moves extremities without difficulty  ?Other:  Underwear and gauze without visible blood from outside (just placed by nursing staff) ? ?Medical Decision Making  ?Medically screening exam initiated at 1:47 PM.  Appropriate orders placed.  Shelby Ford was informed that the remainder of the evaluation will be completed by another provider, this initial triage assessment does not replace that evaluation, and the importance of remaining in the ED until their evaluation is complete. ? ?Labs and imaging ordered ?  ?Prince Rome, PA-C ?77/41/42 1359 ? ?

## 2021-07-20 NOTE — ED Provider Notes (Signed)
Patient is a critically ill-appearing 46 year old female presenting with severe vaginal hemorrhage for several days.  She presents weak, tachycardic, hypotensive with mild lower abdominal discomfort.  She has gone through multiple pads and multiple boxes over the last several days. ? ?On exam the blood pressure is currently 76/50, her heart rate is 130, she has palpable pulses at the radial arteries but they are weak.  There is no edema, she appears slightly jaundiced in the eyes and is extremely pale and the conjunctive a, the mucous membranes and her nailbeds. ? ?Review of the medical record shows that this patient has had multiple visits to the hospital, review of external records shows that she has been admitted to hospitals in the past, she has had blood transfusions as recently as November 2022 for hemoglobin that was less than 3. ? 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 ? ?Patient will need a rapid resuscitation including blood transfusion, IV fluids, 2 IVs, I personally placed the second IV in the patient's right wrist. ? ?The patient is agreeable to transfusion and admission to the hospital.  She is critically ill. ? ?D/w Dr. Otis Peak who will come to see the patient  ? ?Angiocath insertion ?Performed by: Johnna Acosta ? ?Consent: Verbal consent obtained. ?Risks and benefits: risks, benefits and alternatives were discussed ?Time out: Immediately prior to procedure a "time out" was called to verify the correct patient, procedure, equipment, support staff and site/side marked as required. ? ?Preparation: Patient was prepped and draped in the usual sterile fashion. ? ?Vein Location: R wrist ? ?Not Ultrasound Guided ? ?Gauge: 20  ? ?Normal blood return and flush without difficulty ?Patient tolerance: Patient tolerated the procedure well with no immediate complications. ? ?Final diagnoses:  ?Hemorrhage  ?Pelvic pain  ? ? ? ?  ?Shelby Chapel, MD ?07/22/21 [phone] ? ?

## 2021-07-20 NOTE — ED Triage Notes (Signed)
Patient arrived via EMS with complaint vaginal bleeding. Has bled through to stretcher and has been hypotensive despite 150 fluid bolus. Has IV left hand ?

## 2021-07-20 NOTE — ED Provider Notes (Signed)
[facility] EMERGENCY DEPARTMENT Provider Note   CSN: [id number] Arrival date & time: 07/20/21  1318     History  Chief Complaint  Patient presents with   Vaginal Bleeding    Shelby Ford is a 46 y.o. female. With past medical history of CHF, IDA, daily alcohol use who presents to the emergency department with vaginal bleeding.  States she has had 3 days of ongoing heavy vaginal bleeding. States she has been using over a package of pads daily and more to control bleeding. Endorses lightheadedness, dizziness. States she is having cramping abdominal pain more on the left side. States menstrual periods are usually predictable, regular, and 3-4 days. States she normally only uses 4 pads/day for normal periods. States she can only remember 1 previous episode of heavy vaginal bleeding "many many years ago." LMP mid January.   Denies bleeding gums, epistaxis, hematochezia or melena.   Vaginal Bleeding Associated symptoms: dizziness, fatigue and nausea       Home Medications Prior to Admission medications   Medication Sig Start Date End Date Taking? Authorizing Provider  ferrous sulfate 325 (65 FE) MG tablet Take 1 tablet (325 mg total) by mouth 3 (three) times daily with meals. 04/24/21   Lelon Perla, MD  furosemide (LASIX) 20 MG tablet Take 3 tablets (60 mg total) by mouth daily. 03/28/21 04/27/21  Lelon Perla, MD  ivabradine (CORLANOR) 7.5 MG TABS tablet Please take 15mg  two hours before cardiac CT scan. 04/10/21   Lelon Perla, MD  metoprolol tartrate (LOPRESSOR) 25 MG tablet TAKE 2 HOURS PRIOR TO CT SCAN 04/24/21   Lelon Perla, MD  omeprazole (PRILOSEC) 20 MG capsule Take 1 capsule (20 mg total) by mouth daily. 03/16/12   Palumbo, April, MD  potassium chloride SA (KLOR-CON) 20 MEQ tablet Take 2 tablets (40 mEq total) by mouth 2 (two) times daily. 04/03/21 05/03/21  Lelon Perla, MD      Allergies    Patient has no known allergies.     Review of Systems   Review of Systems  Constitutional:  Positive for fatigue.  Respiratory:  Positive for shortness of breath.   Cardiovascular:  Negative for chest pain.  Gastrointestinal:  Positive for nausea. Negative for blood in stool and vomiting.  Genitourinary:  Positive for menstrual problem, pelvic pain and vaginal bleeding. Negative for hematuria.  Neurological:  Positive for dizziness and light-headedness.  All other systems reviewed and are negative.  Physical Exam Updated Vital Signs BP (!) 99/56 (BP Location: Right Arm)    Pulse (!) 127    Temp 98.8 F (37.1 C) (Oral)    Resp 16    LMP 06/04/2021 (Approximate)    SpO2 100%  Physical Exam Vitals and nursing note reviewed. Exam conducted with a chaperone present.  Constitutional:      General: She is in acute distress.     Appearance: Normal appearance. She is ill-appearing.  HENT:     Head: Normocephalic.     Mouth/Throat:     Mouth: Mucous membranes are dry.  Eyes:     General: Scleral icterus present.     Extraocular Movements: Extraocular movements intact.     Pupils: Pupils are equal, round, and reactive to light.  Cardiovascular:     Rate and Rhythm: Regular rhythm. Tachycardia present.     Pulses: Normal pulses.     Heart sounds: Normal heart sounds. No murmur heard. Pulmonary:     Effort: Pulmonary effort is  normal. No respiratory distress.     Breath sounds: Normal breath sounds.  Abdominal:     General: Bowel sounds are normal. There is no distension.     Palpations: Abdomen is soft.     Tenderness: There is abdominal tenderness. There is no guarding or rebound.  Genitourinary:    General: Normal vulva.     Exam position: Lithotomy position.     Tanner stage (genital): 5.     Labia:        Right: No lesion.        Left: No lesion.      Vagina: No signs of injury. Bleeding present. No vaginal discharge or lesions.     Cervix: Cervical bleeding present.     Comments: Copious bleeding on exam.  Large blood clots and maroon colored bleeding from cervix.  No vaginal wall injuries Musculoskeletal:        General: Normal range of motion.     Cervical back: Neck supple.  Skin:    General: Skin is warm and dry.     Capillary Refill: Capillary refill takes less than 2 seconds.     Coloration: Skin is pale.  Neurological:     General: No focal deficit present.     Mental Status: She is alert and oriented to person, place, and time.  Psychiatric:        Mood and Affect: Mood normal.        Behavior: Behavior normal.        Thought Content: Thought content normal.        Judgment: Judgment normal.    ED Results / Procedures / Treatments   Labs (all labs ordered are listed, but only abnormal results are displayed) Labs Reviewed  CBC WITH DIFFERENTIAL/PLATELET - Abnormal; Notable for the following components:      Result Value   WBC 11.6 (*)    RBC 1.69 (*)    Hemoglobin 4.3 (*)    HCT 15.6 (*)    MCH 25.4 (*)    MCHC 27.6 (*)    RDW 31.8 (*)    nRBC 0.4 (*)    All other components within normal limits  PROTIME-INR - Abnormal; Notable for the following components:   Prothrombin Time 16.1 (*)    INR 1.3 (*)    All other components within normal limits  I-STAT CHEM 8, ED - Abnormal; Notable for the following components:   Potassium 3.1 (*)    BUN <3 (*)    Creatinine, Ser 0.40 (*)    Glucose, Bld 139 (*)    Calcium, Ion 0.92 (*)    TCO2 21 (*)    Hemoglobin 5.8 (*)    HCT 17.0 (*)    All other components within normal limits  WET PREP, GENITAL  RESP PANEL BY RT-PCR (FLU A&B, COVID) ARPGX2  URINALYSIS, ROUTINE W REFLEX MICROSCOPIC  COMPREHENSIVE METABOLIC PANEL  ETHANOL  RAPID URINE DRUG SCREEN, HOSP PERFORMED  I-STAT BETA HCG BLOOD, ED ([facility], [facility], [facility] ONLY)  TYPE AND SCREEN  PREPARE RBC (CROSSMATCH)  GC/CHLAMYDIA PROBE AMP ([place]) NOT AT [facility]   EKG None  Radiology No results found.  Procedures .Critical Care Performed by: Mickie Hillier,  PA-C Authorized by: Mickie Hillier, PA-C   Critical care provider statement:    Critical care time (minutes):  40   Critical care time was exclusive of:  Separately billable procedures and treating other patients   Critical care was necessary to treat or  prevent imminent or life-threatening deterioration of the following conditions:  Shock   Critical care was time spent personally by me on the following activities:  Development of treatment plan with patient or surrogate, discussions with consultants, discussions with primary provider, evaluation of patient's response to treatment, examination of patient, interpretation of cardiac output measurements, obtaining history from patient or surrogate, review of old charts, re-evaluation of patient's condition, pulse oximetry, ordering and review of radiographic studies, ordering and review of laboratory studies and ordering and performing treatments and interventions   I assumed direction of critical care for this patient from another provider in my specialty: no     Care discussed with: admitting provider     Medications Ordered in ED Medications  0.9 %  sodium chloride infusion (has no administration in time range)  megestrol (MEGACE) tablet 80 mg (has no administration in time range)  sodium chloride 0.9 % bolus 1,000 mL (1,000 mLs Intravenous New Bag/Given 07/20/21 1425)  sodium chloride 0.9 % bolus 1,000 mL (1,000 mLs Intravenous New Bag/Given 07/20/21 1524)   ED Course/ Medical Decision Making/ A&P                           Medical Decision Making Amount and/or Complexity of Data Reviewed Labs: ordered.  Risk Prescription drug management. Decision regarding hospitalization.  Patient presents to the ED with complaints of vaginal hemorrhage. This involves an extensive number of treatment options, and is a complaint that carries with it a high risk of complications and morbidity.   Additional history obtained:  Additional history obtained  from: significant other at bedside  External records from outside source obtained and reviewed including: previous OBGYN, ED visits  EKG: EKG: sinus tachycardia.   Cardiac Monitoring: The patient was maintained on a cardiac monitor.  I personally viewed and interpreted the cardiac monitored which showed an underlying rhythm of: sinus tachycardia   Lab Results: I personally ordered, reviewed, and interpreted labs. Pertinent results include: I-stat: Hgb 5.8 Not pregnant PT/INR 16.1/1.3 CBC: Hgb 4.3, Hct 15.6  Imaging Studies ordered:  I ordered imaging studies which included ultrasound.  I independently reviewed & interpreted imaging & am in agreement with radiology impression. Imaging shows: Ultrasound Pelvic Complete Results pending   Medications  I ordered medication including Megace for uterine hemorrhage, IVF for resuscitation, 2 U PRBC for hemorrhagic shock  Critical Interventions: IVF, blood products for resuscitation  Consultations: I requested consultation with the OBGYN,  and Dr. Sabra Heck, ED attending discussed lab and imaging findings as well as pertinent plan - they recommend: Dr. Nelda Marseille recommends Megace 80mg  BID. She will come to bedside to assess patient.  ED Course: 46 year old female who presents to the emergency department with uterine hemorrhage.  She is shocky on presentation with hypotension, tachycardia. She is actively bleeding. She was immediately started on IVF  2L which is consistent with 82ml/kg resuscitation. Additionally, ordered 2UPRBC immediately given her physical exam. She had quick physical exam and then pelvic exam performed with myself and Dr. Sabra Heck, ED attending. There is copious clots present in the vaginal vault and continued vaginal bleeding from the cervix. Dr. Sabra Heck packed the vagina with Kerlex until OBGYN is able to be present.  She then had bedside ultrasound   Dr. Nelda Marseille evaluated the patient at bedside. She recommends admission  overnight for observation. Recommends Megace 80mg  BID, which has been ordered.  Patient care being handed off to [facility], PA-C and Dr. Sherry Ruffing. Patient  still pending blood products and re-evaluation. Patient would benefit from admission and observation for ongoing hemorrhage after initial resuscitation.   Final Clinical Impression(s) / ED Diagnoses Final diagnoses:  None    Rx / DC Orders ED Discharge Orders     None         Mickie Hillier, PA-C 07/20/21 1534    Noemi Chapel, MD 07/22/21 [phone]

## 2021-07-21 DIAGNOSIS — E876 Hypokalemia: Secondary | ICD-10-CM | POA: Diagnosis not present

## 2021-07-21 DIAGNOSIS — D500 Iron deficiency anemia secondary to blood loss (chronic): Secondary | ICD-10-CM

## 2021-07-21 DIAGNOSIS — R102 Pelvic and perineal pain: Secondary | ICD-10-CM

## 2021-07-21 LAB — TYPE AND SCREEN
ABO/RH(D): B POS
Antibody Screen: NEGATIVE
Unit division: 0
Unit division: 0
Unit division: 0

## 2021-07-21 LAB — BPAM RBC
Blood Product Expiration Date: 202303182359
Blood Product Expiration Date: 202303182359
Blood Product Expiration Date: 202303182359
ISSUE DATE / TIME: 202303031622
ISSUE DATE / TIME: 202303031847
ISSUE DATE / TIME: 202303032244
Unit Type and Rh: 7300
Unit Type and Rh: 7300
Unit Type and Rh: 7300

## 2021-07-21 LAB — COMPREHENSIVE METABOLIC PANEL
ALT: 50 U/L — ABNORMAL HIGH (ref 0–44)
ALT: 53 U/L — ABNORMAL HIGH (ref 0–44)
AST: 135 U/L — ABNORMAL HIGH (ref 15–41)
AST: 180 U/L — ABNORMAL HIGH (ref 15–41)
Albumin: 2.1 g/dL — ABNORMAL LOW (ref 3.5–5.0)
Albumin: 2.2 g/dL — ABNORMAL LOW (ref 3.5–5.0)
Alkaline Phosphatase: 105 U/L (ref 38–126)
Alkaline Phosphatase: 109 U/L (ref 38–126)
Anion gap: 10 (ref 5–15)
Anion gap: 6 (ref 5–15)
BUN: <5 mg/dL — ABNORMAL LOW (ref 6–20)
BUN: <5 mg/dL — ABNORMAL LOW (ref 6–20)
CO2: 25 mmol/L (ref 22–32)
CO2: 22 mmol/L (ref 22–32)
Calcium: 6.9 mg/dL — ABNORMAL LOW (ref 8.9–10.3)
Calcium: 7.3 mg/dL — ABNORMAL LOW (ref 8.9–10.3)
Chloride: 104 mmol/L (ref 98–111)
Chloride: 109 mmol/L (ref 98–111)
Creatinine, Ser: 0.55 mg/dL (ref 0.44–1.00)
Creatinine, Ser: 0.53 mg/dL (ref 0.44–1.00)
GFR, Estimated: >60 mL/min (ref >60)
GFR, Estimated: >60 mL/min (ref >60)
Glucose, Bld: 95 mg/dL (ref 70–99)
Glucose, Bld: 96 mg/dL (ref 70–99)
Potassium: 2.7 mmol/L — CL (ref 3.5–5.1)
Potassium: 3.5 mmol/L (ref 3.5–5.1)
Sodium: 139 mmol/L (ref 135–145)
Sodium: 137 mmol/L (ref 135–145)
Total Bilirubin: 2.9 mg/dL — ABNORMAL HIGH (ref 0.3–1.2)
Total Bilirubin: 1.8 mg/dL — ABNORMAL HIGH (ref 0.3–1.2)
Total Protein: 5.1 g/dL — ABNORMAL LOW (ref 6.5–8.1)
Total Protein: 5.1 g/dL — ABNORMAL LOW (ref 6.5–8.1)

## 2021-07-21 LAB — HEPATITIS PANEL, ACUTE
HCV Ab: NONREACTIVE
Hep A IgM: NONREACTIVE
Hep B C IgM: NONREACTIVE
Hepatitis B Surface Ag: NONREACTIVE

## 2021-07-21 LAB — MAGNESIUM
Magnesium: 0.9 mg/dL — CL (ref 1.7–2.4)
Magnesium: 2.2 mg/dL (ref 1.7–2.4)

## 2021-07-21 LAB — CBC
HCT: 24.5 % — ABNORMAL LOW (ref 36.0–46.0)
Hemoglobin: 8.2 g/dL — ABNORMAL LOW (ref 12.0–15.0)
MCH: 27.8 pg (ref 26.0–34.0)
MCHC: 33.5 g/dL (ref 30.0–36.0)
MCV: 83.1 fL (ref 80.0–100.0)
Platelets: 147 10*3/uL — ABNORMAL LOW (ref 150–400)
RBC: 2.95 MIL/uL — ABNORMAL LOW (ref 3.87–5.11)
RDW: 20 % — ABNORMAL HIGH (ref 11.5–15.5)
WBC: 9.2 10*3/uL (ref 4.0–10.5)
nRBC: 0.3 % — ABNORMAL HIGH (ref 0.0–0.2)

## 2021-07-21 MED ORDER — ASCORBIC ACID 500 MG PO TABS
500.0000 mg | ORAL_TABLET | Freq: Every day | ORAL | Status: DC
Start: 2021-07-21 — End: 2021-07-21
  Administered 2021-07-21: 500 mg via ORAL
  Filled 2021-07-21: qty 1

## 2021-07-21 MED ORDER — FERROUS SULFATE 325 (65 FE) MG PO TABS
325.0000 mg | ORAL_TABLET | ORAL | Status: DC
  Administered 2021-07-21: 325 mg via ORAL
  Filled 2021-07-21: qty 1

## 2021-07-21 MED ORDER — MAGNESIUM SULFATE 2 GM/50ML IV SOLN
2.0000 g | Freq: Once | INTRAVENOUS | Status: AC
  Administered 2021-07-21: 2 g via INTRAVENOUS
  Filled 2021-07-21: qty 50

## 2021-07-21 MED ORDER — MEGESTROL ACETATE 40 MG PO TABS: ORAL_TABLET | ORAL | 0 refills | Status: AC

## 2021-07-21 MED ORDER — POTASSIUM CHLORIDE CRYS ER 20 MEQ PO TBCR: 30.0000 meq | EXTENDED_RELEASE_TABLET | Freq: Two times a day (BID) | ORAL | Status: DC

## 2021-07-21 MED ORDER — POTASSIUM CHLORIDE 10 MEQ/100ML IV SOLN
10.0000 meq | INTRAVENOUS | Status: AC
  Administered 2021-07-21 (×6): 10 meq via INTRAVENOUS
  Filled 2021-07-21 (×6): qty 100

## 2021-07-21 MED ORDER — POTASSIUM CHLORIDE CRYS ER 20 MEQ PO TBCR: 40.0000 meq | EXTENDED_RELEASE_TABLET | Freq: Two times a day (BID) | ORAL | Status: DC

## 2021-07-21 NOTE — Discharge Summary (Signed)
Gynecology Physician Discharge Summary  ?Patient ID: ?Shelby Ford ?MRN: [id number] ?DOB/AGE: [birth year] 46 y.o. ? ?Admit Date: 07/20/2021 ?Discharge Date: 07/21/2021 ? ?Admission Diagnoses:  ?  Symptomatic anemia ?  Alcohol abuse ?  Poor compliance ?  History of heart failure ?  AV fibroblastoma ?  Tobacco abuse ? ?Discharge Diagnoses: ?  Same ? ?Procedures: Blood transfusion, 3 PRBCs ? ?Hospital Course:  ?Shelby Ford is a 46 y.o. G4P4  admitted for symptomatic anemia due to bleeding.  She underwent the blood transfusion. She had electrolyte repletion of her K+ and Magnesium. Her bleeding slowed down significantly on the Megace. She was deemed stable for discharge to home and desired discharge home.  ? ?Significant Labs: ?CBC Latest Ref Rng & Units 07/21/2021 07/20/2021 07/20/2021  ?WBC 4.0 - 10.5 K/uL 9.2 - 11.6(H)  ?Hemoglobin 12.0 - 15.0 g/dL 8.2(L) 5.8(LL) 4.3(LL)  ?Hematocrit 36.0 - 46.0 % 24.5(L) 17.0(L) 15.6(L)  ?Platelets 150 - 400 K/uL 147(L) - 239  ? ? ?Discharge Exam: ?Blood pressure 100/60, pulse 70, temperature 98.7 ?F (37.1 ?C), temperature source Oral, resp. rate 17, height '5\' 3"'$  (1.6 m), weight 57.2 kg, last menstrual period 06/04/2021, SpO2 100 %. ?General appearance: alert and no distress  ?Resp: clear to auscultation bilaterally  ?Cardio: regular rate and rhythm  ?GI: soft, non-tender; bowel sounds normal; no masses, no organomegaly.  ?Extremities: extremities normal, atraumatic, no cyanosis or edema and Homans sign is negative, no sign of DVT ? ?Discharged Condition: Stable ? ?Disposition: Discharge disposition: 01-Home or Self Care ? ? ? ? ? ? ?Discharge Instructions   ? ? Activity as tolerated - No restrictions   Complete by: As directed ?  ? Call MD for:  difficulty breathing, headache or visual disturbances   Complete by: As directed ?  ? Call MD for:  persistant nausea and vomiting   Complete by: As directed ?  ? Call MD for:  redness, tenderness, or signs of infection (pain, swelling,  redness, odor or green/yellow discharge around incision site)   Complete by: As directed ?  ? Call MD for:  severe uncontrolled pain   Complete by: As directed ?  ? Call MD for:  temperature >100.4   Complete by: As directed ?  ? Diet - low sodium heart healthy   Complete by: As directed ?  ? Driving Restrictions   Complete by: As directed ?  ? When not taking narcotic pain medication and would not hesitate to use the breaks, usually about 7 days  ? May shower / Bathe   Complete by: As directed ?  ? ?  ? ?Allergies as of 07/21/2021   ? ?   Reactions  ? Shrimp (diagnostic) Other (See Comments)  ? Throat closes  ? ?  ? ?  ?Medication List  ?  ? ?STOP taking these medications   ? ?furosemide 20 MG tablet ?Commonly known as: LASIX ?  ?ivabradine 7.5 MG Tabs tablet ?Commonly known as: CORLANOR ?  ?metoprolol tartrate 25 MG tablet ?Commonly known as: LOPRESSOR ?  ? ?  ? ?TAKE these medications   ? ?diphenhydrAMINE 25 MG tablet ?Commonly known as: BENADRYL ?Take 25 mg by mouth every 6 (six) hours as needed for allergies. ?  ?FeroSul 325 (65 FE) MG tablet ?Generic drug: ferrous sulfate ?Take 1 tablet (325 mg total) by mouth 3 (three) times daily with meals. ?  ?Goodys Extra Strength [id number] MG Pack ?Generic drug: Aspirin-Acetaminophen-Caffeine ?Take 1 packet by mouth daily as needed (  pain). ?  ?megestrol 40 MG tablet ?Commonly known as: MEGACE ?Take 2 tablets (80 mg total) by mouth 2 (two) times daily for 5 days, THEN 1 tablet (40 mg total) daily. ?Start taking on: July 21, 2021 ?  ?omeprazole 20 MG capsule ?Commonly known as: PRILOSEC ?Take 1 capsule (20 mg total) by mouth daily. ?  ?potassium chloride SA 20 MEQ tablet ?Commonly known as: KLOR-CON M ?Take 2 tablets (40 mEq total) by mouth 2 (two) times daily. ?  ? ?  ? ?No future appointments. ? Follow-up Information   ? ? [facility] [employer] at [facility]. Schedule an appointment as soon as possible for a visit in 2 week(s).   ?Specialty:  Obstetrics and Gynecology ?Contact information: ?[place] ?[place] [address] ?[phone] ? ?  ?  ? ?  ?  ? ?  ? ? ?Total discharge time: 15 minutes  ? ?Signed: ? ?Radene Gunning, MD, FACOG ?Attending Obstetrician & Gynecologist ?Faculty Practice, [facility] ? ? ? ?

## 2021-07-21 NOTE — Social Work (Signed)
CSW consulted for substance abuse counseling. ? ?CSW met with patient to assess and provide support. CSW introduced self and role. CSW informed patient of the reason for visit and assessed current feelings. Patient stated she is currently doing well and ready to go home. CSW inquired on patient's substance use. Patient shared she does consume alcohol, however she does not find it to be an issue. Patient laughed as she shared consumption depends on how her work days has been. CSW asked patient if she would like resources related to substance abuse, patient stated no. Patient expressed she is able to manage her consumption on her own.  ? ?Patient stated she has not been experiencing any SI or HI. Patient reported she has adequate support at home and transportation home. Patient denies having any additional needs at this time.  ? ?Shelby Ford, LCSWA ?Clinical Social Work ?[facility] ?[phone] ? ?

## 2021-07-21 NOTE — Progress Notes (Signed)
Date and time results received: 07/21/21 ?(use smartphrase ".now" to insert current time) ? ?Test:potassium  ?Critical Value:2.7 ?             Mag :0.9 ?Name of Provider Notified: PICKENS ? ?Orders Received; MD will put in new orders. ?

## 2021-07-21 NOTE — Progress Notes (Signed)
[facility] OB/GYN Attending Progress Note  ADMISSION DIAGNOSIS:   Principal Problem:   Anemia Active Problems:   Symptomatic anemia   Alcohol abuse   Poor compliance   History of heart failure   AV fibroblastoma   Tobacco abuse   Subjective  Pt resting comfortably in bed, reports no acute complaints.  States her bleeding has restarted this am, but not nearly as heavy as previously.  Denies dizziness or headache this am.  Denies nausea/vomiting.  Tolerating po. Pt ambulating and voiding without difficulty.   Objective  VITALS:  height is '5\' 3"'$  (1.6 m) and weight is 57.2 kg. Her oral temperature is 98.7 F (37.1 C). Her blood pressure is 84/53 (abnormal) and her pulse is 80. Her respiration is 17 and oxygen saturation is 99%.   EXAMINATION: CONSTITUTIONAL: NAD EYES: Conjunctivae and EOM are normal. Pupils are equal, round, and reactive to light.   SKIN: Skin is warm and dry. Color much improved, no pallor NEUROLOGIC: Alert and oriented to person, place, and time. No cranial nerve deficit noted. PSYCHIATRIC: Normal mood and affect. Normal behavior. Normal judgment and thought content. CARDIOVASCULAR: Normal heart rate noted RESPIRATORY: Effort and breath sounds normal, no problems with respiration noted MUSCULOSKELETAL: No edema, no calf tenderness bilaterally ABDOMEN: soft and non-tender  Laboratory Reports: Results for orders placed or performed during the hospital encounter of 07/20/21 (from the past 72 hour(s))  CBC with Differential     Status: Abnormal   Collection Time: 07/20/21  2:17 PM  Result Value Ref Range   WBC 11.6 (H) 4.0 - 10.5 K/uL   RBC 1.69 (L) 3.87 - 5.11 MIL/uL   Hemoglobin 4.3 (LL) 12.0 - 15.0 g/dL    Comment: REPEATED TO VERIFY THIS CRITICAL RESULT HAS VERIFIED AND BEEN CALLED TO Mertha Finders, RN BY DANIELLE LONG ON 03 03 2023 AT 1509, AND HAS BEEN READ BACK.     HCT 15.6 (L) 36.0 - 46.0 %   MCV 92.3 80.0 - 100.0  fL   MCH 25.4 (L) 26.0 - 34.0 pg   MCHC 27.6 (L) 30.0 - 36.0 g/dL   RDW 31.8 (H) 11.5 - 15.5 %   Platelets 239 150 - 400 K/uL   nRBC 0.4 (H) 0.0 - 0.2 %   Neutrophils Relative % 72 %   Neutro Abs 8.4 (H) 1.7 - 7.7 K/uL   Lymphocytes Relative 18 %   Lymphs Abs 2.0 0.7 - 4.0 K/uL   Monocytes Relative 9 %   Monocytes Absolute 1.1 (H) 0.1 - 1.0 K/uL   Eosinophils Relative 0 %   Eosinophils Absolute 0.0 0.0 - 0.5 K/uL   Basophils Relative 0 %   Basophils Absolute 0.0 0.0 - 0.1 K/uL   WBC Morphology See Note    RBC Morphology See Note    Smear Review MORPHOLOGY UNREMARKABLE    Immature Granulocytes 1 %   Abs Immature Granulocytes 0.08 (H) 0.00 - 0.07 K/uL   Hypersegmented Neutrophils PRESENT    Polychromasia PRESENT     Comment: Performed at [facility], [address], [place], [place] [address]  Protime-INR     Status: Abnormal   Collection Time: 07/20/21  2:17 PM  Result Value Ref Range   Prothrombin Time 16.1 (H) 11.4 - 15.2 seconds   INR 1.3 (H) 0.8 - 1.2    Comment: (NOTE) INR goal varies based on device and disease states. Performed at [facility], [place] [address], [place], [place] [address]   Comprehensive metabolic panel     Status: Abnormal   Collection Time: 07/20/21  2:25 PM  Result Value Ref Range   Sodium 135 135 - 145 mmol/L   Potassium 3.2 (L) 3.5 - 5.1 mmol/L   Chloride 102 98 - 111 mmol/L   CO2 20 (L) 22 - 32 mmol/L   Glucose, Bld 148 (H) 70 - 99 mg/dL    Comment: Glucose reference range applies only to samples taken after fasting for at least 8 hours.   BUN <5 (L) 6 - 20 mg/dL   Creatinine, Ser 0.72 0.44 - 1.00 mg/dL   Calcium 7.5 (L) 8.9 - 10.3 mg/dL   Total Protein 6.3 (L) 6.5 - 8.1 g/dL   Albumin 2.6 (L) 3.5 - 5.0 g/dL   AST 130 (H) 15 - 41 U/L   ALT 64 (H) 0 - 44 U/L   Alkaline Phosphatase 141 (H) 38 - 126 U/L   Total Bilirubin 1.0 0.3 - 1.2 mg/dL   GFR, Estimated >60 >60 mL/min    Comment: (NOTE) Calculated using the CKD-EPI  Creatinine Equation (2021)    Anion gap 13 5 - 15    Comment: Performed at [facility], [place] [address] [place], [place] [address]  Prepare RBC (crossmatch)     Status: None   Collection Time: 07/20/21  2:29 PM  Result Value Ref Range   Order Confirmation      ORDER PROCESSED BY BLOOD BANK Performed at [facility], [place] [address], [place], [place] [address]   Ethanol     Status: None   Collection Time: 07/20/21  2:30 PM  Result Value Ref Range   Alcohol, Ethyl (B) <10 <10 mg/dL    Comment: (NOTE) Lowest detectable limit for serum alcohol is 10 mg/dL.  For medical purposes only. Performed at [facility], [place] [address] [place], [place] [address]   Type and screen [place]     Status: None (Preliminary result)   Collection Time: 07/20/21  2:31 PM  Result Value Ref Range   ABO/RH(D) B POS    Antibody Screen NEG    Sample Expiration 07/23/2021,2359    Unit Number [id number]    Blood Component Type RED CELLS,LR    Unit division 00    Status of Unit ISSUED    Transfusion Status OK TO TRANSFUSE    Crossmatch Result Compatible    Unit Number [id number]    Blood Component Type RED CELLS,LR    Unit division 00    Status of Unit ISSUED    Transfusion Status OK TO TRANSFUSE    Crossmatch Result Compatible    Unit Number [id number]    Blood Component Type RED CELLS,LR    Unit division 00    Status of Unit ISSUED    Transfusion Status OK TO TRANSFUSE    Crossmatch Result      Compatible Performed at [facility], [place] [address] [place],  [address]   I-Stat beta hCG blood, ED     Status: None   Collection Time: 07/20/21  3:01 PM  Result Value Ref Range   I-stat hCG, quantitative <5.0 <5 mIU/mL   Comment 3            Comment:   GEST. AGE      CONC.  (mIU/mL)   <=1 WEEK        5 -  50     2 WEEKS       50 - 500     3 WEEKS       100 - 10,000     4 WEEKS     1,000 - 30,000        FEMALE AND NON-PREGNANT FEMALE:      LESS THAN 5 mIU/mL   I-stat chem 8, ED     Status: Abnormal   Collection Time: 07/20/21  3:03 PM  Result Value Ref Range   Sodium 136 135 - 145 mmol/L   Potassium 3.1 (L) 3.5 - 5.1 mmol/L   Chloride 99 98 - 111 mmol/L   BUN <3 (L) 6 - 20 mg/dL   Creatinine, Ser 0.40 (L) 0.44 - 1.00 mg/dL   Glucose, Bld 139 (H) 70 - 99 mg/dL    Comment: Glucose reference range applies only to samples taken after fasting for at least 8 hours.   Calcium, Ion 0.92 (L) 1.15 - 1.40 mmol/L   TCO2 21 (L) 22 - 32 mmol/L   Hemoglobin 5.8 (LL) 12.0 - 15.0 g/dL   HCT 17.0 (L) 36.0 - 46.0 %   Comment NOTIFIED PHYSICIAN   Resp Panel by RT-PCR (Flu A&B, Covid) Nasopharyngeal Swab     Status: None   Collection Time: 07/20/21  3:18 PM   Specimen: Nasopharyngeal Swab; Nasopharyngeal(NP) swabs in vial transport medium  Result Value Ref Range   SARS Coronavirus 2 by RT PCR NEGATIVE NEGATIVE    Comment: (NOTE) SARS-CoV-2 target nucleic acids are NOT DETECTED.  The SARS-CoV-2 RNA is generally detectable in upper respiratory specimens during the acute phase of infection. The lowest concentration of SARS-CoV-2 viral copies this assay can detect is 138 copies/mL. A negative result does not preclude SARS-Cov-2 infection and should not be used as the sole basis for treatment or other patient management decisions. A negative result may occur with  improper specimen collection/handling, submission of specimen other than nasopharyngeal swab, presence of viral mutation(s) within the areas targeted by this assay, and inadequate number of viral copies(<138 copies/mL). A negative result must be combined with clinical observations, patient history, and epidemiological information. The expected result is Negative.  Fact Sheet for Patients:  EntrepreneurPulse.com.au  Fact Sheet for Healthcare Providers:  IncredibleEmployment.be  This test is no t yet approved or cleared by the [place] FDA and  has been authorized for detection and/or diagnosis of SARS-CoV-2 by FDA under an Emergency Use Authorization (EUA). This EUA will remain  in effect (meaning this test can be used) for the duration of the COVID-19 declaration under Section 564(b)(1) of the Act, 21 U.S.C.section 360bbb-3(b)(1), unless the authorization is terminated  or revoked sooner.       Influenza A by PCR NEGATIVE NEGATIVE   Influenza B by PCR NEGATIVE NEGATIVE    Comment: (NOTE) The Xpert Xpress SARS-CoV-2/FLU/RSV plus assay is intended as an aid in the diagnosis of influenza from Nasopharyngeal swab specimens and should not be used as a sole basis for treatment. Nasal washings and aspirates are unacceptable for Xpert Xpress SARS-CoV-2/FLU/RSV testing.  Fact Sheet for Patients: EntrepreneurPulse.com.au  Fact Sheet for Healthcare Providers: IncredibleEmployment.be  This test is not yet approved or cleared by the [place] FDA and has been authorized for detection and/or diagnosis of SARS-CoV-2 by FDA under an Emergency Use Authorization (EUA). This EUA will remain in effect (meaning this test can be used) for the duration of the COVID-19 declaration under Section 564(b)(1) of the  Act, 21 U.S.C. section 360bbb-3(b)(1), unless the authorization is terminated or revoked.  Performed at [facility], [place] [address], [place], [place] [address]   Prepare RBC (crossmatch)     Status: None   Collection Time: 07/20/21 10:19 PM  Result Value Ref Range   Order Confirmation      ORDER PROCESSED BY BLOOD BANK Performed at [facility], [place] [address], [place], [place] [address]   Rapid urine drug screen (hospital performed)     Status: None   Collection Time: 07/20/21 10:24 PM  Result Value Ref Range   Opiates NONE DETECTED NONE DETECTED   Cocaine NONE DETECTED NONE DETECTED   Benzodiazepines NONE DETECTED NONE DETECTED   Amphetamines NONE  DETECTED NONE DETECTED   Tetrahydrocannabinol NONE DETECTED NONE DETECTED   Barbiturates NONE DETECTED NONE DETECTED    Comment: (NOTE) DRUG SCREEN FOR MEDICAL PURPOSES ONLY.  IF CONFIRMATION IS NEEDED FOR ANY PURPOSE, NOTIFY LAB WITHIN 5 DAYS.  LOWEST DETECTABLE LIMITS FOR URINE DRUG SCREEN Drug Class                     Cutoff (ng/mL) Amphetamine and metabolites    1000 Barbiturate and metabolites    200 Benzodiazepine                 144 Tricyclics and metabolites     300 Opiates and metabolites        300 Cocaine and metabolites        300 THC                            50 Performed at [facility], [place] [address], [place], [place] [address]   Urinalysis, Routine w reflex microscopic     Status: Abnormal   Collection Time: 07/20/21 10:24 PM  Result Value Ref Range   Color, Urine YELLOW YELLOW   APPearance CLEAR CLEAR   Specific Gravity, Urine 1.004 (L) 1.005 - 1.030   pH 7.0 5.0 - 8.0   Glucose, UA NEGATIVE NEGATIVE mg/dL   Hgb urine dipstick LARGE (A) NEGATIVE   Bilirubin Urine NEGATIVE NEGATIVE   Ketones, ur NEGATIVE NEGATIVE mg/dL   Protein, ur NEGATIVE NEGATIVE mg/dL   Nitrite NEGATIVE NEGATIVE   Leukocytes,Ua NEGATIVE NEGATIVE   RBC / HPF >50 (H) 0 - 5 RBC/hpf   WBC, UA 11-20 0 - 5 WBC/hpf   Bacteria, UA MANY (A) NONE SEEN   Squamous Epithelial / LPF 0-5 0 - 5   Mucus PRESENT     Comment: Performed at [facility], [address] [place], [place] [address]  CBC     Status: Abnormal   Collection Time: 07/21/21  5:45 AM  Result Value Ref Range   WBC 9.2 4.0 - 10.5 K/uL   RBC 2.95 (L) 3.87 - 5.11 MIL/uL   Hemoglobin 8.2 (L) 12.0 - 15.0 g/dL    Comment: REPEATED TO VERIFY POST TRANSFUSION SPECIMEN    HCT 24.5 (L) 36.0 - 46.0 %   MCV 83.1 80.0 - 100.0 fL    Comment: POST TRANSFUSION SPECIMEN REPEATED TO VERIFY    MCH 27.8 26.0 - 34.0 pg   MCHC 33.5 30.0 - 36.0 g/dL   RDW 20.0 (H) 11.5 - 15.5 %   Platelets 147 (L) 150 - 400 K/uL   nRBC 0.3  (H) 0.0 - 0.2 %    Comment: Performed at [facility], [place] [address], [place], [place] [address]  Comprehensive metabolic panel     Status: Abnormal   Collection Time: 07/21/21  5:45 AM  Result Value Ref Range   Sodium 139 135 - 145 mmol/L   Potassium 2.7 (LL) 3.5 - 5.1 mmol/L    Comment: CRITICAL RESULT CALLED TO, READ BACK BY AND VERIFIED WITH: Erasmo Leventhal, [place], [phone], 07/21/21, E. ADEDOKUN.    Chloride 104 98 - 111 mmol/L   CO2 25 22 - 32 mmol/L   Glucose, Bld 95 70 - 99 mg/dL    Comment: Glucose reference range applies only to samples taken after fasting for at least 8 hours.   BUN <5 (L) 6 - 20 mg/dL   Creatinine, Ser 0.55 0.44 - 1.00 mg/dL   Calcium 6.9 (L) 8.9 - 10.3 mg/dL   Total Protein 5.1 (L) 6.5 - 8.1 g/dL   Albumin 2.1 (L) 3.5 - 5.0 g/dL   AST 135 (H) 15 - 41 U/L   ALT 50 (H) 0 - 44 U/L   Alkaline Phosphatase 105 38 - 126 U/L   Total Bilirubin 2.9 (H) 0.3 - 1.2 mg/dL   GFR, Estimated >60 >60 mL/min    Comment: (NOTE) Calculated using the CKD-EPI Creatinine Equation (2021)    Anion gap 10 5 - 15    Comment: Performed at [facility], [place] [address], [place], [place] [address]  Magnesium     Status: Abnormal   Collection Time: 07/21/21  5:45 AM  Result Value Ref Range   Magnesium 0.9 (LL) 1.7 - 2.4 mg/dL    Comment: CRITICAL RESULT CALLED TO, READ BACK BY AND VERIFIED WITH: Erasmo Leventhal, RN, [phone], 07/21/21, E. ADEDOKUN. Performed at [facility], [place] [address], [place], [place] [address]    Radiology Reports: US PELVIC COMPLETE W TRANSVAGINAL AND TORSION R/O  Result Date: 07/20/2021 CLINICAL DATA:  Pelvic pain and bleeding. EXAM: TRANSABDOMINAL AND TRANSVAGINAL ULTRASOUND OF PELVIS DOPPLER ULTRASOUND OF OVARIES TECHNIQUE: Both transabdominal and transvaginal ultrasound examinations of the pelvis were performed. Transabdominal technique was performed for global imaging of the pelvis including uterus, ovaries, adnexal regions, and pelvic cul-de-sac.  It was necessary to proceed with endovaginal exam following the transabdominal exam to visualize the ovaries and endometrium. Color and duplex Doppler ultrasound was utilized to evaluate blood flow to the ovaries. COMPARISON:  None. FINDINGS: Uterus Measurements: 8.6 x 4.9 x 5.5 cm = volume: 121 mL. No fibroids or other mass visualized. Endometrium Thickness: 1.7 cm. Mildly diffusely heterogeneous with indistinct margins. Right ovary Measurements: 2.4 x 3.4 x 1.7 cm = volume: 7.3 mL. Normal appearance/no adnexal mass. Left ovary Measurements: 4.2 x 2.0 x 2.5 cm = volume: 11.0 mL. Normal appearance/no adnexal mass. Pulsed Doppler evaluation of both ovaries demonstrates normal low-resistance arterial and venous waveforms. Dominant follicle noted measuring 2.2 cm. Other findings No abnormal free fluid. IMPRESSION: 1. Endometrium is mildly diffusely heterogeneous with indistinct margins measuring 1.7 cm. Findings are indeterminate and may be related to adenomyosis. If bleeding remains unresponsive to hormonal or medical therapy, focal lesion work-up with sonohysterogram should be considered. Endometrial biopsy should also be considered in pre-menopausal patients at high risk for endometrial carcinoma. (Ref: Radiological Reasoning: Algorithmic Workup of Abnormal Vaginal Bleeding with Endovaginal Sonography and Sonohysterography. AJR 2008; 768:T15-72) Electronically Signed   By: Ronney Asters M.D.   On: 07/20/2021 15:48    I personally reviewed Labs and Imaging Studies under Results section.    ASSESSMENT Symptomatic  Anemia Abnormal uterine bleeding Hypokalemia Hypomagnesemia Elevated LFTs  PLAN -continue Megace '80mg'$  bid -s/p 3upRBCs, both clinical and lab improvement, Hgb now 8.2 -continue iron every other day with vitamin C  2)FEN -IV K replacement ordered, repeat labs 2 hr post infusion -Mag replacement ordered -regular diet -encourage ambulation  3) Elevated LFTs -hep panel pending  4) AV  Fibroelastoma -cardiology noted reviewed, pt aware that surgery indicated and potential risk of embolic event and potential death -pt does not want surgical intervention, she understands the risk -encouraged pt to reconsider follow up with either PCP or cardiology  DISPO: Anemia and AUB have improved.  Plan to work on electrolyte supplementation today.  If repeat labs improved, plan for possible discharge home later today    LOS: 1 day   Janyth Pupa, DO Attending [place], [facility] for [employer], [place]

## 2022-03-14 ENCOUNTER — Other Ambulatory Visit: Payer: Self-pay | Admitting: Obstetrics and Gynecology

## 2022-03-14 DIAGNOSIS — N939 Abnormal uterine and vaginal bleeding, unspecified: Secondary | ICD-10-CM

## 2023-01-27 IMAGING — CR DG CHEST 2V
2 series · 2 of 2 positions shown · non-contrast
Comparison: Chest x-ray 03/16/2012

CLINICAL DATA: Shortness of breath

EXAM:
CHEST - 2 VIEW

[w chest pa]
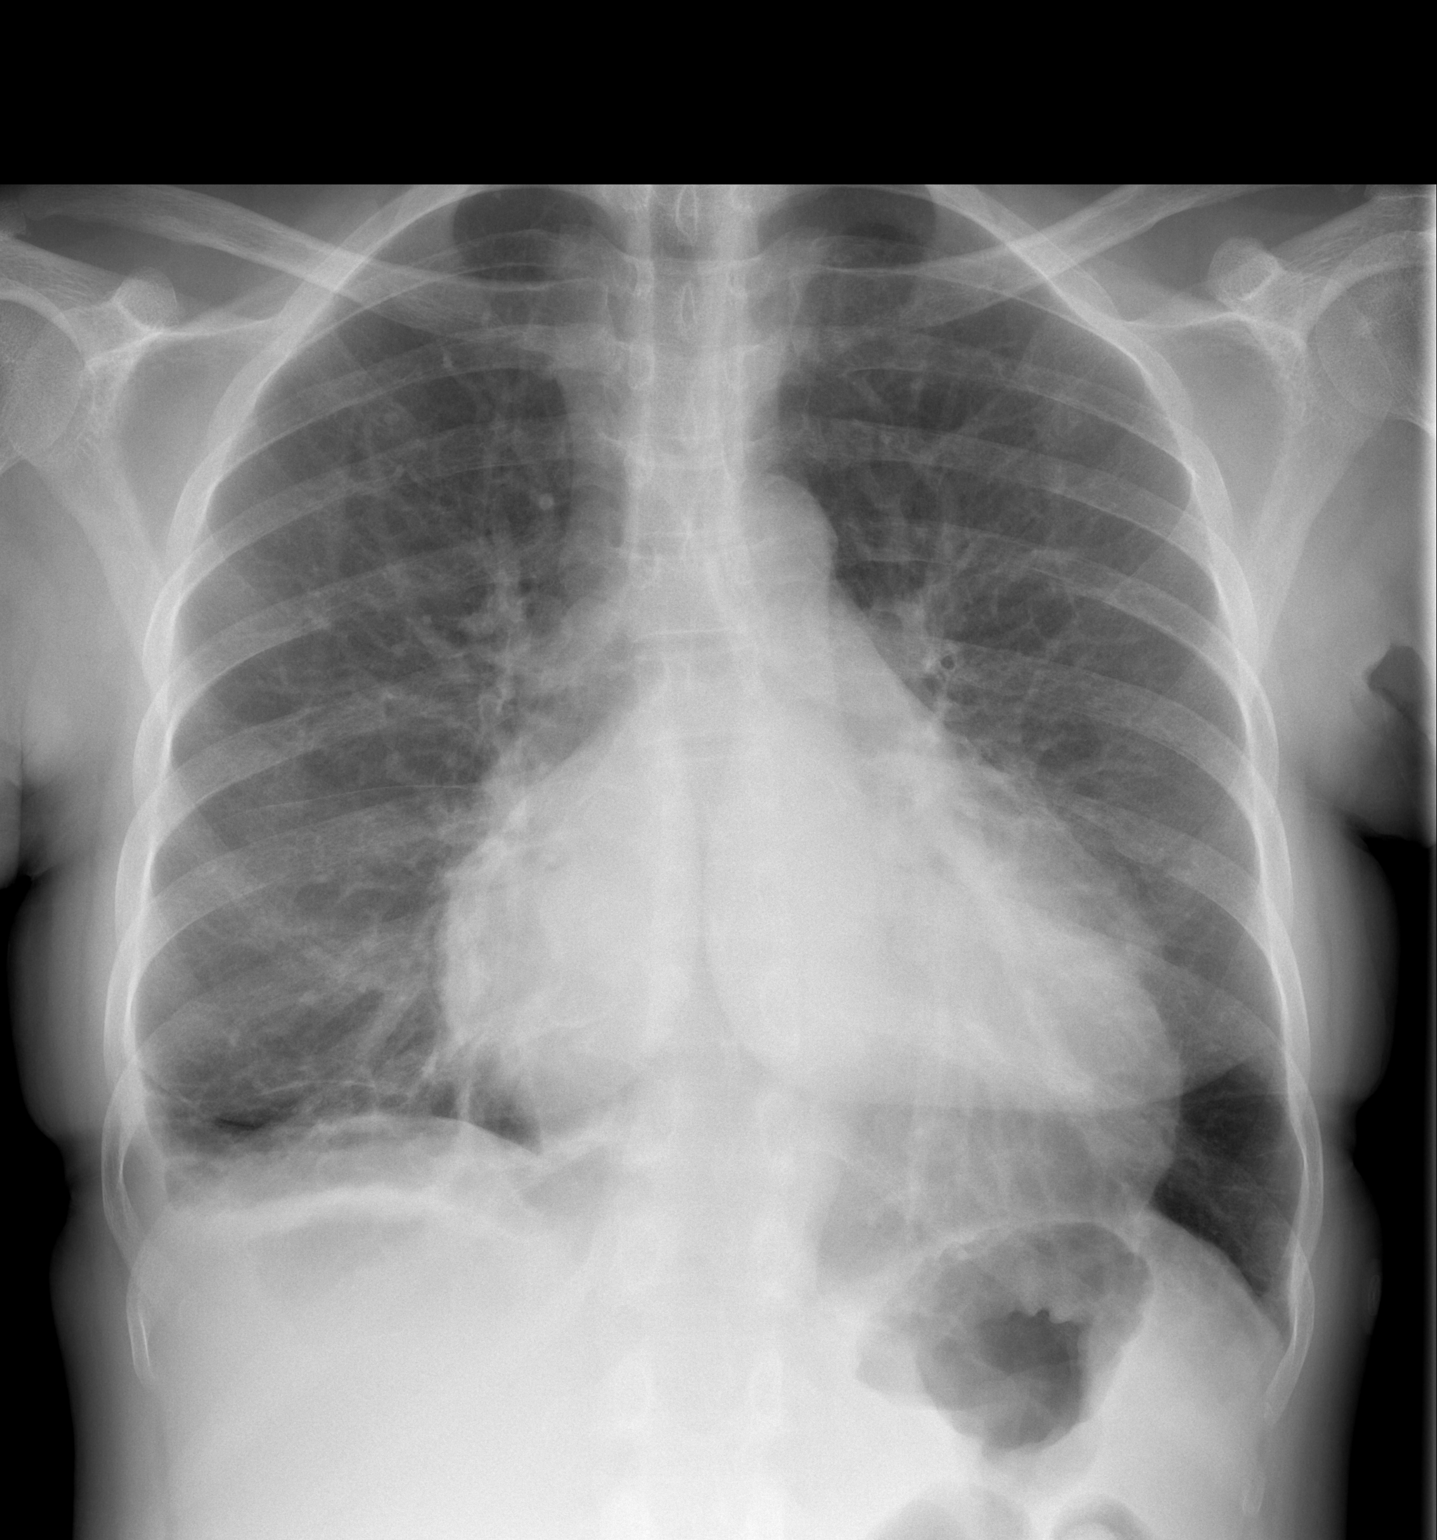

[w chest lat]
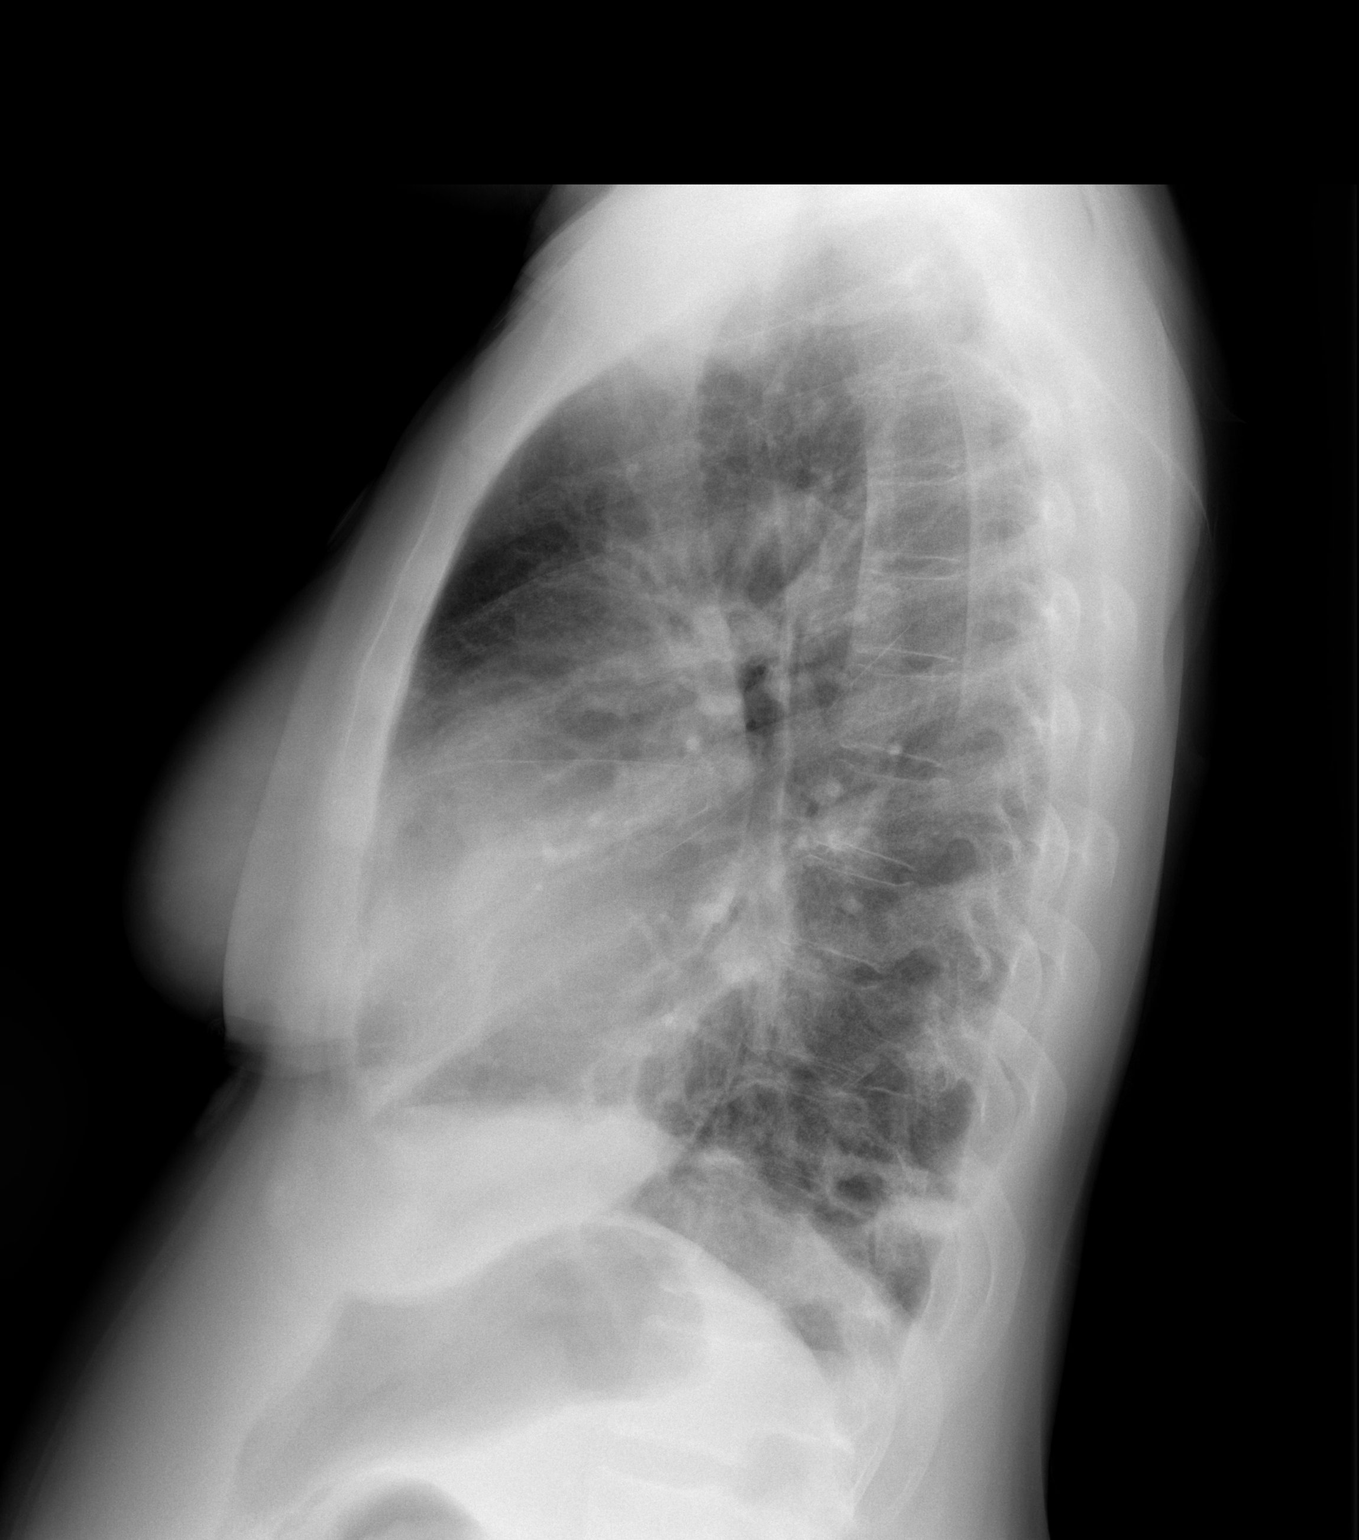

[2 of 2 positions shown; findings below may reference images not displayed]

FINDINGS: Heart is enlarged. Mediastinum appears within normal limits.
Pulmonary vasculature is normal. Blunting of the right costophrenic
angle with mild irregular opacities at the right lung base. No
significant effusion visualized on the lateral film. No
pneumothorax.
IMPRESSION: 1. Cardiomegaly.
2. Likely trace right pleural effusion with associated
atelectasis/infiltrate at the right lung base.

## 2023-05-27 IMAGING — US US PELVIS COMPLETE TRANSABD/TRANSVAG W DUPLEX AND/OR DOPPLER
1 series · 13 of 25 positions shown · non-contrast
Comparison: None.

CLINICAL DATA: Pelvic pain and bleeding.



[Series 1: us pelvic complete w transvaginal and torsion righ · 13 of 107 slices shown]
[im 1/107]
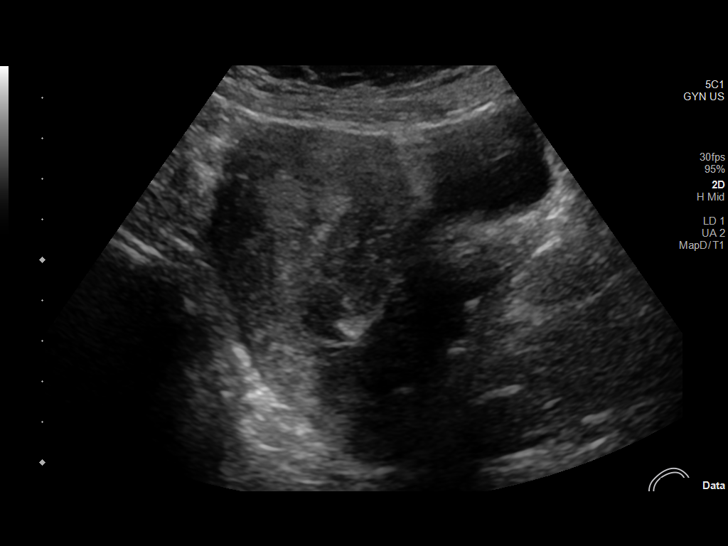
[im 9/107]
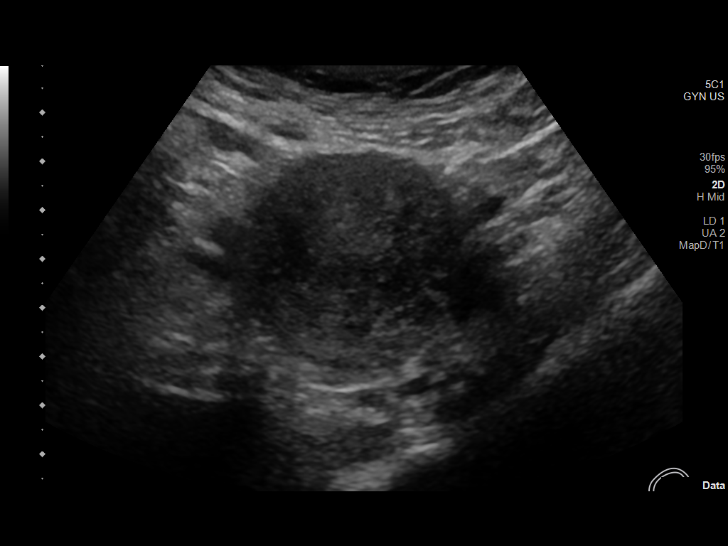
[im 18/107]
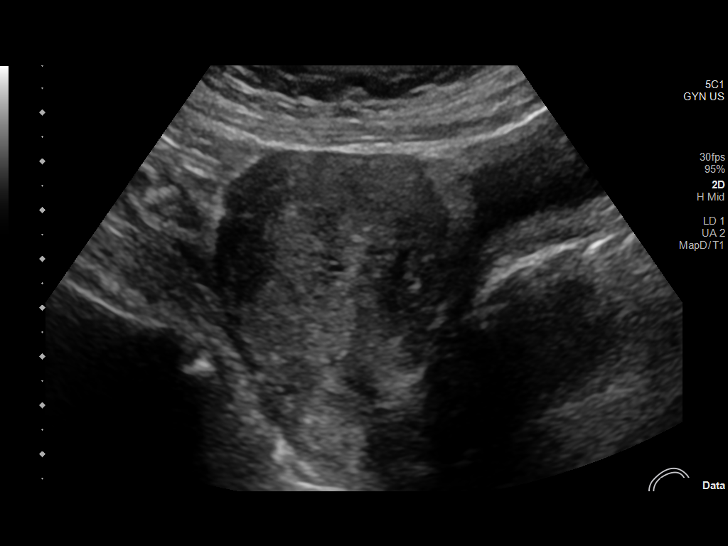
[im 27/107]
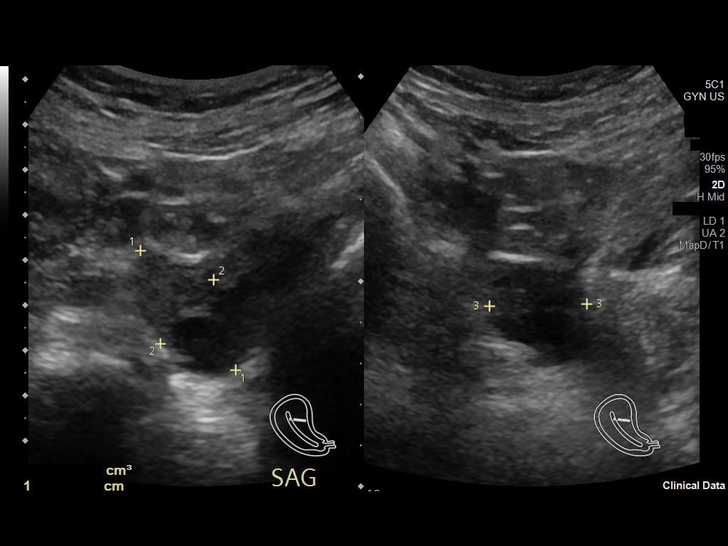
[im 36/107]
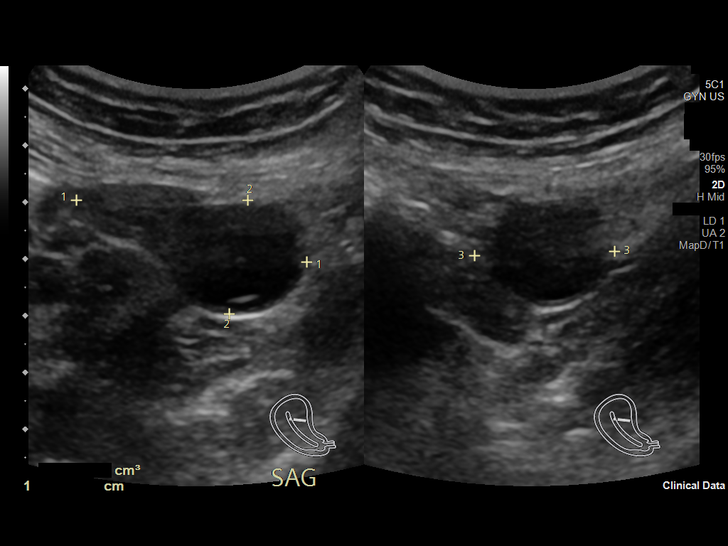
[im 45/107]
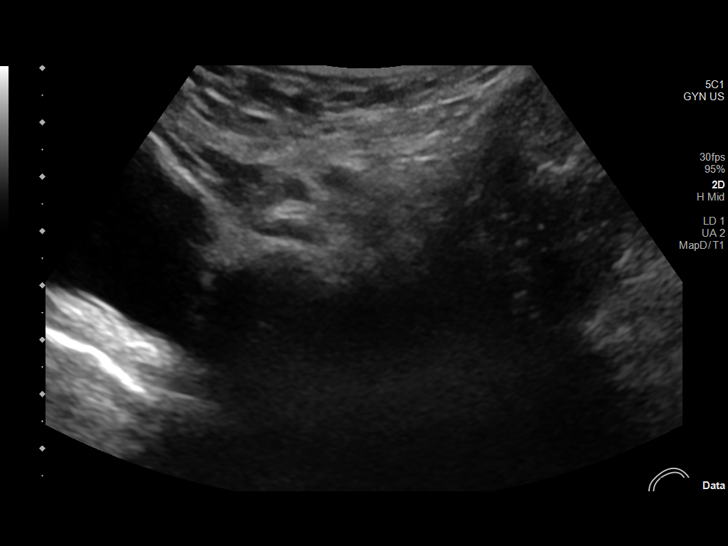
[im 54/107]
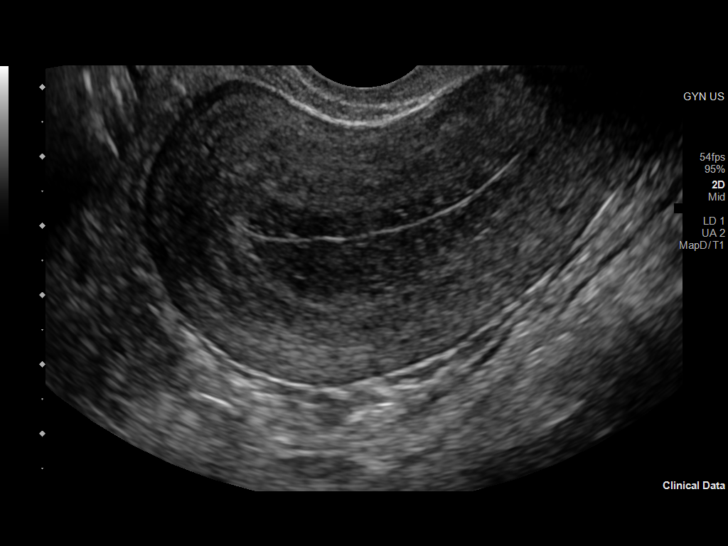
[im 62/107]
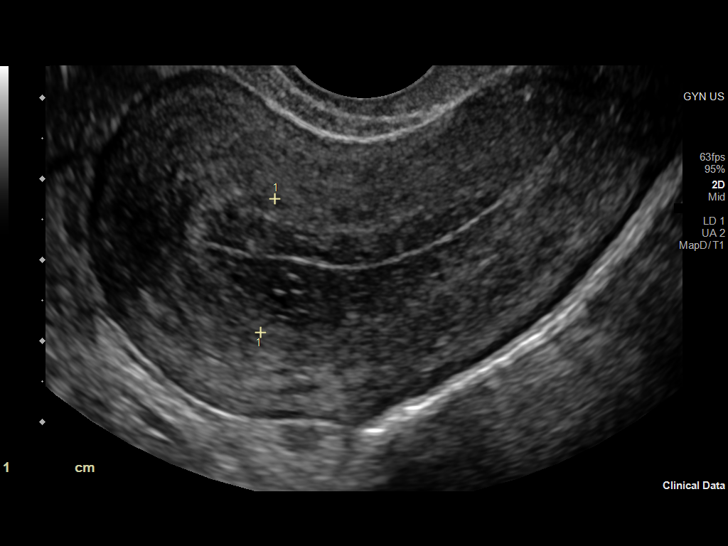
[im 71/107]
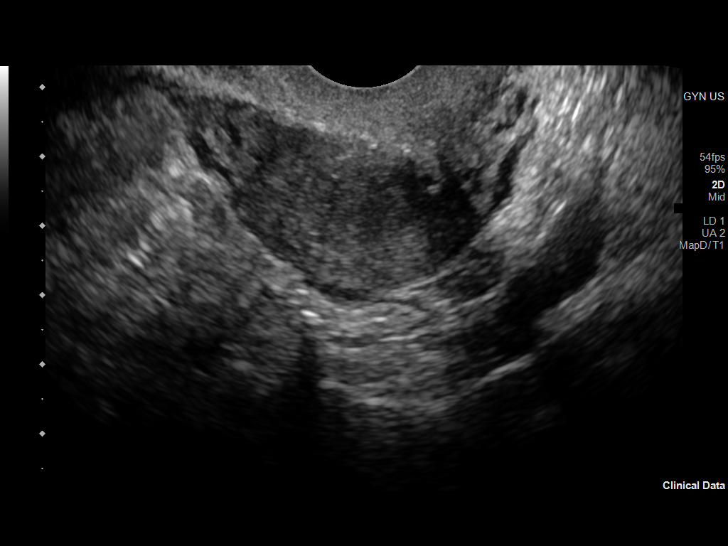
[im 80/107]
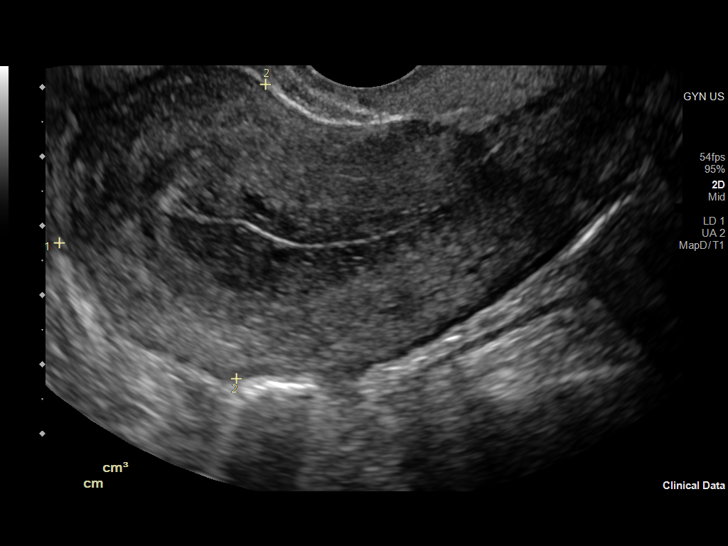
[im 89/107]
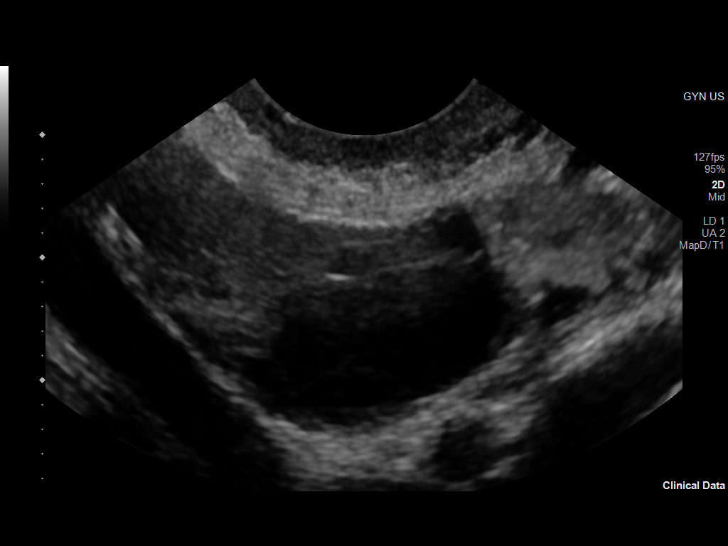
[im 98/107]
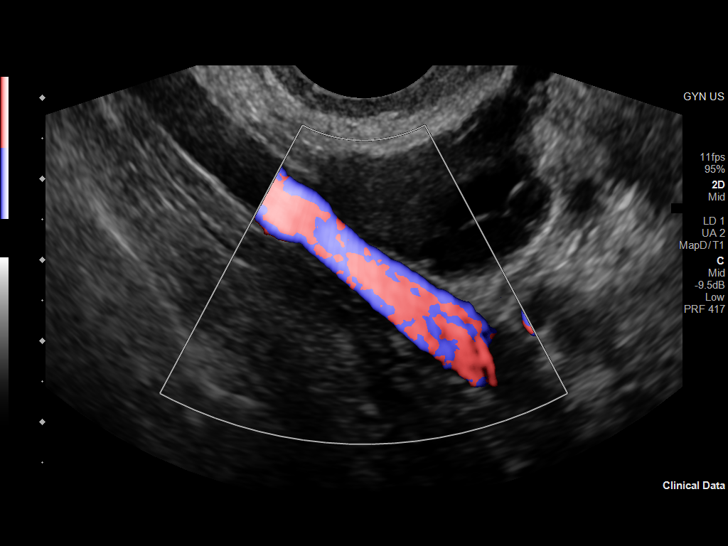
[im 107/107]
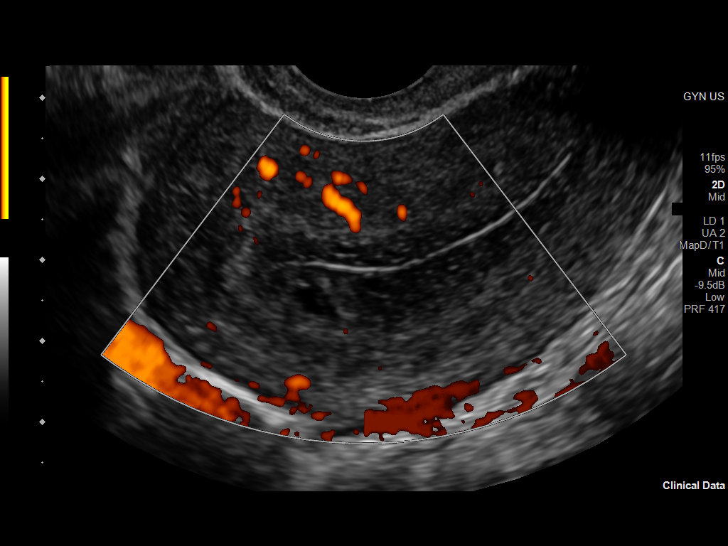

[13 of 25 positions shown; findings below may reference images not displayed]

FINDINGS: Uterus

Measurements: 8.6 x 4.9 x 5.5 cm = volume: 121 mL. No fibroids or
other mass visualized.

Endometrium

Thickness: 1.7 cm. Mildly diffusely heterogeneous with indistinct
margins.

Right ovary

Measurements: 2.4 x 3.4 x 1.7 cm = volume: 7.3 mL. Normal
appearance/no adnexal mass.

Left ovary

Measurements: 4.2 x 2.0 x 2.5 cm = volume: 11.0 mL. Normal
appearance/no adnexal mass.

Pulsed Doppler evaluation of both ovaries demonstrates normal
low-resistance arterial and venous waveforms. Dominant follicle
noted measuring 2.2 cm.

Other findings

No abnormal free fluid.
IMPRESSION: 1. Endometrium is mildly diffusely heterogeneous with indistinct
margins measuring 1.7 cm. Findings are indeterminate and may be
related to adenomyosis. If bleeding remains unresponsive to hormonal
or medical therapy, focal lesion work-up with sonohysterogram should
be considered. Endometrial biopsy should also be considered in
pre-menopausal patients at high risk for endometrial carcinoma.
(Ref: Radiological Reasoning: Algorithmic Workup of Abnormal Vaginal
Bleeding with Endovaginal Sonography and Sonohysterography. AJR
0223; 191:S68-73)
